# Patient Record
Sex: Female | Born: 1970 | Hispanic: Yes | Marital: Married | Smoking: Never smoker
Health system: Southern US, Community
[De-identification: ages and names within clinical notes are randomized; demographics above are authoritative.]

## PROBLEM LIST (undated history)

## (undated) DIAGNOSIS — E785 Hyperlipidemia, unspecified: Secondary | ICD-10-CM

## (undated) DIAGNOSIS — M1990 Unspecified osteoarthritis, unspecified site: Secondary | ICD-10-CM

## (undated) DIAGNOSIS — E119 Type 2 diabetes mellitus without complications: Secondary | ICD-10-CM

## (undated) DIAGNOSIS — I10 Essential (primary) hypertension: Secondary | ICD-10-CM

## (undated) HISTORY — DX: Unspecified osteoarthritis, unspecified site: M19.90

---

## 2019-06-19 ENCOUNTER — Other Ambulatory Visit: Payer: Self-pay

## 2019-06-19 ENCOUNTER — Encounter: Payer: Self-pay | Admitting: Intensive Care

## 2019-06-19 ENCOUNTER — Emergency Department
Admission: EM | Admit: 2019-06-19 | Discharge: 2019-06-19 | Disposition: A | Discharge status: Home / Self Care | Attending: Emergency Medicine | Admitting: Emergency Medicine

## 2019-06-19 ENCOUNTER — Emergency Department

## 2019-06-19 DIAGNOSIS — E119 Type 2 diabetes mellitus without complications: Secondary | ICD-10-CM | POA: Insufficient documentation

## 2019-06-19 DIAGNOSIS — R0789 Other chest pain: Secondary | ICD-10-CM | POA: Diagnosis not present

## 2019-06-19 DIAGNOSIS — R079 Chest pain, unspecified: Secondary | ICD-10-CM

## 2019-06-19 DIAGNOSIS — I10 Essential (primary) hypertension: Secondary | ICD-10-CM | POA: Diagnosis not present

## 2019-06-19 HISTORY — DX: Hyperlipidemia, unspecified: E78.5

## 2019-06-19 HISTORY — DX: Type 2 diabetes mellitus without complications: E11.9

## 2019-06-19 HISTORY — DX: Essential (primary) hypertension: I10

## 2019-06-19 LAB — CBC
HCT: 40.6 % (ref 36.0–46.0)
Hemoglobin: 14.4 g/dL (ref 12.0–15.0)
MCH: 32.3 pg (ref 26.0–34.0)
MCHC: 35.5 g/dL (ref 30.0–36.0)
MCV: 91 fL (ref 80.0–100.0)
Platelets: 232 10*3/uL (ref 150–400)
RBC: 4.46 MIL/uL (ref 3.87–5.11)
RDW: 12.7 % (ref 11.5–15.5)
WBC: 8.2 10*3/uL (ref 4.0–10.5)
nRBC: 0 % (ref 0.0–0.2)

## 2019-06-19 LAB — BASIC METABOLIC PANEL
Anion gap: 10 (ref 5–15)
BUN: 12 mg/dL (ref 6–20)
CO2: 22 mmol/L (ref 22–32)
Calcium: 8.9 mg/dL (ref 8.9–10.3)
Chloride: 104 mmol/L (ref 98–111)
Creatinine, Ser: 0.6 mg/dL (ref 0.44–1.00)
GFR calc Af Amer: >60 mL/min (ref >60)
GFR calc non Af Amer: >60 mL/min (ref >60)
Glucose, Bld: 307 mg/dL — ABNORMAL HIGH (ref 70–99)
Potassium: 3.6 mmol/L (ref 3.5–5.1)
Sodium: 136 mmol/L (ref 135–145)

## 2019-06-19 LAB — TROPONIN I (HIGH SENSITIVITY)
Troponin I (High Sensitivity): <2 ng/L (ref <18)
Troponin I (High Sensitivity): <2 ng/L (ref <18)

## 2019-06-19 IMAGING — CR DG CHEST 2V
1 series · 2 of 2 positions shown · non-contrast
Comparison: None.

CLINICAL DATA: Chest pain.

EXAM:
CHEST - 2 VIEW

[Series 1: dg chest 2 view · 0.14mm/px · 2 of 2 slices shown]
[im 1/2]
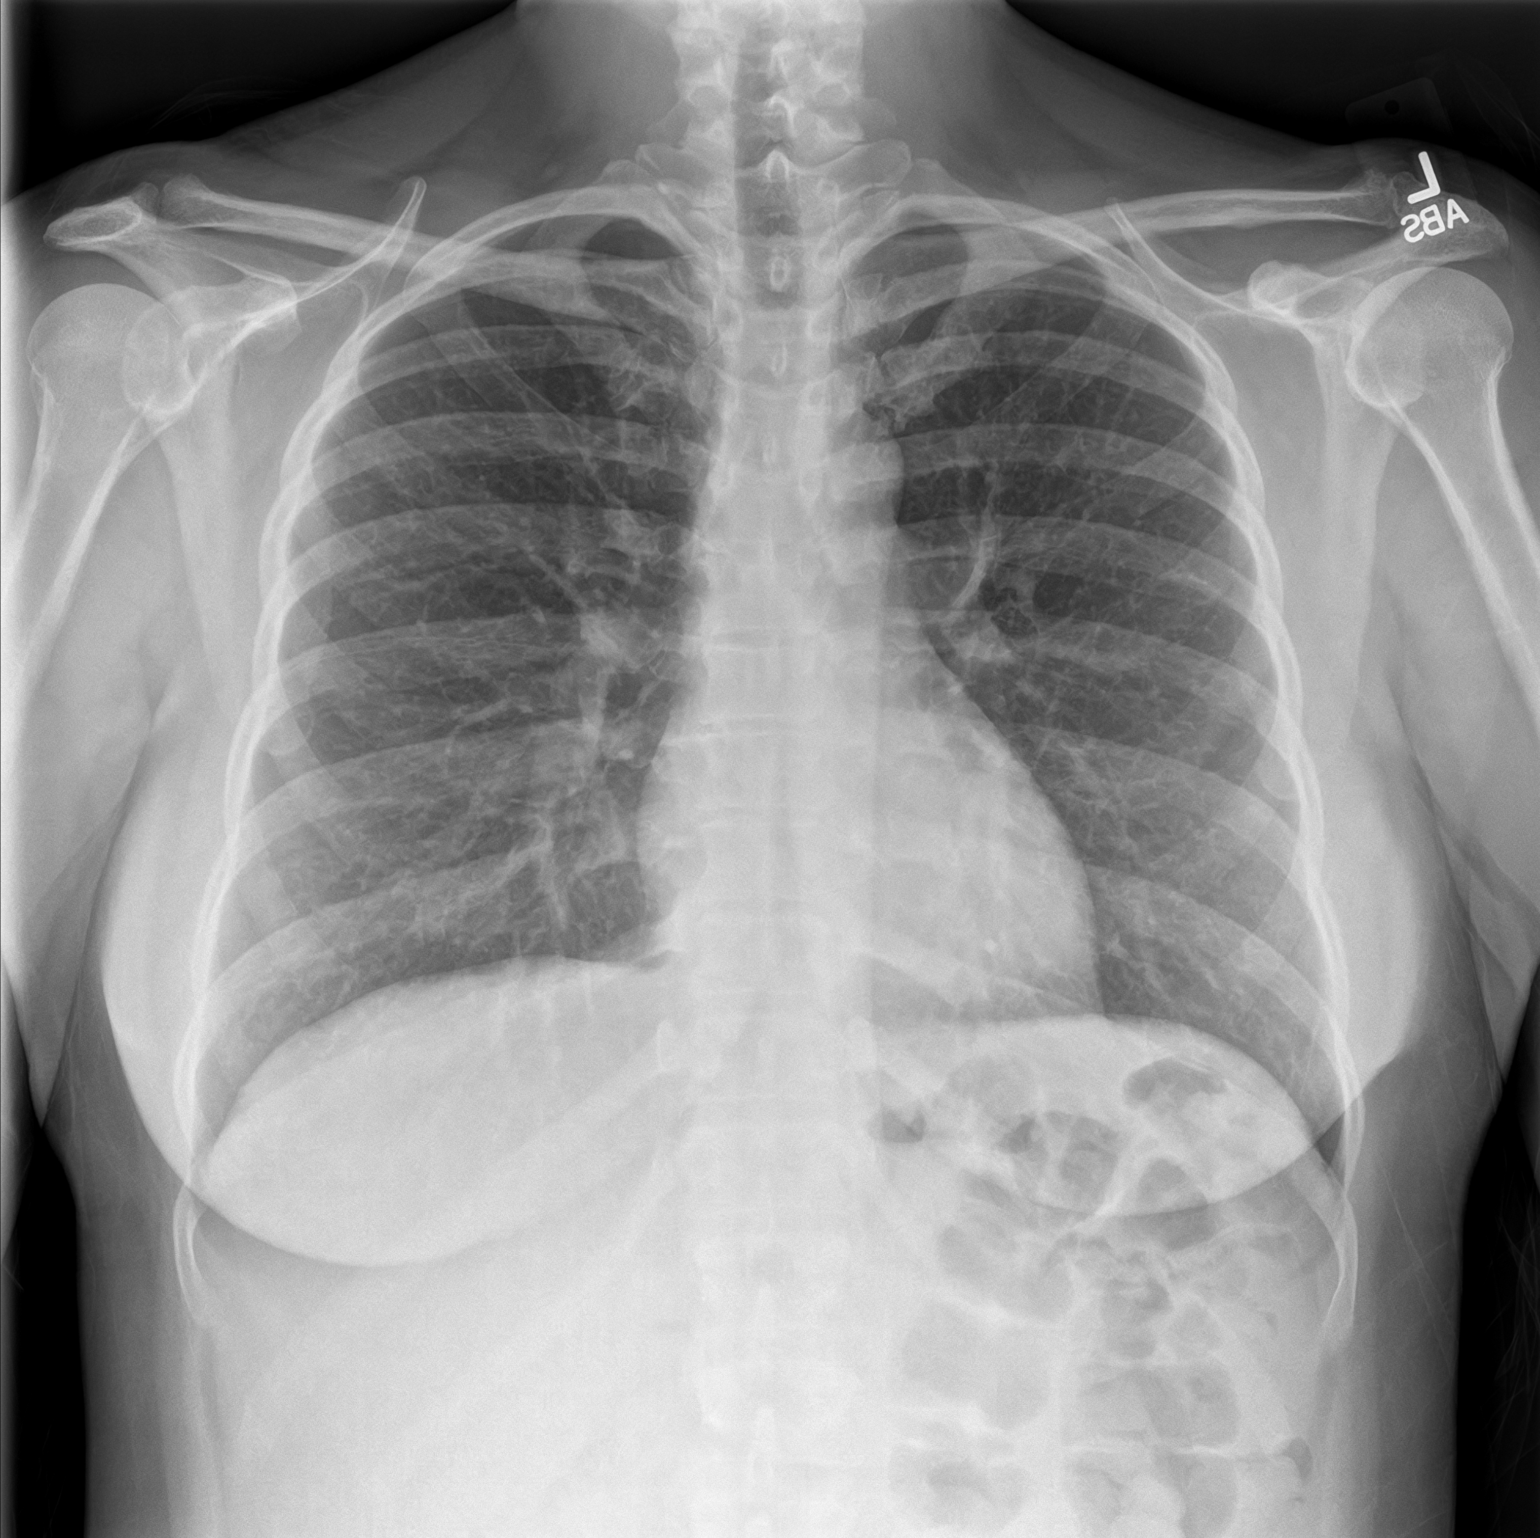
[im 2/2]
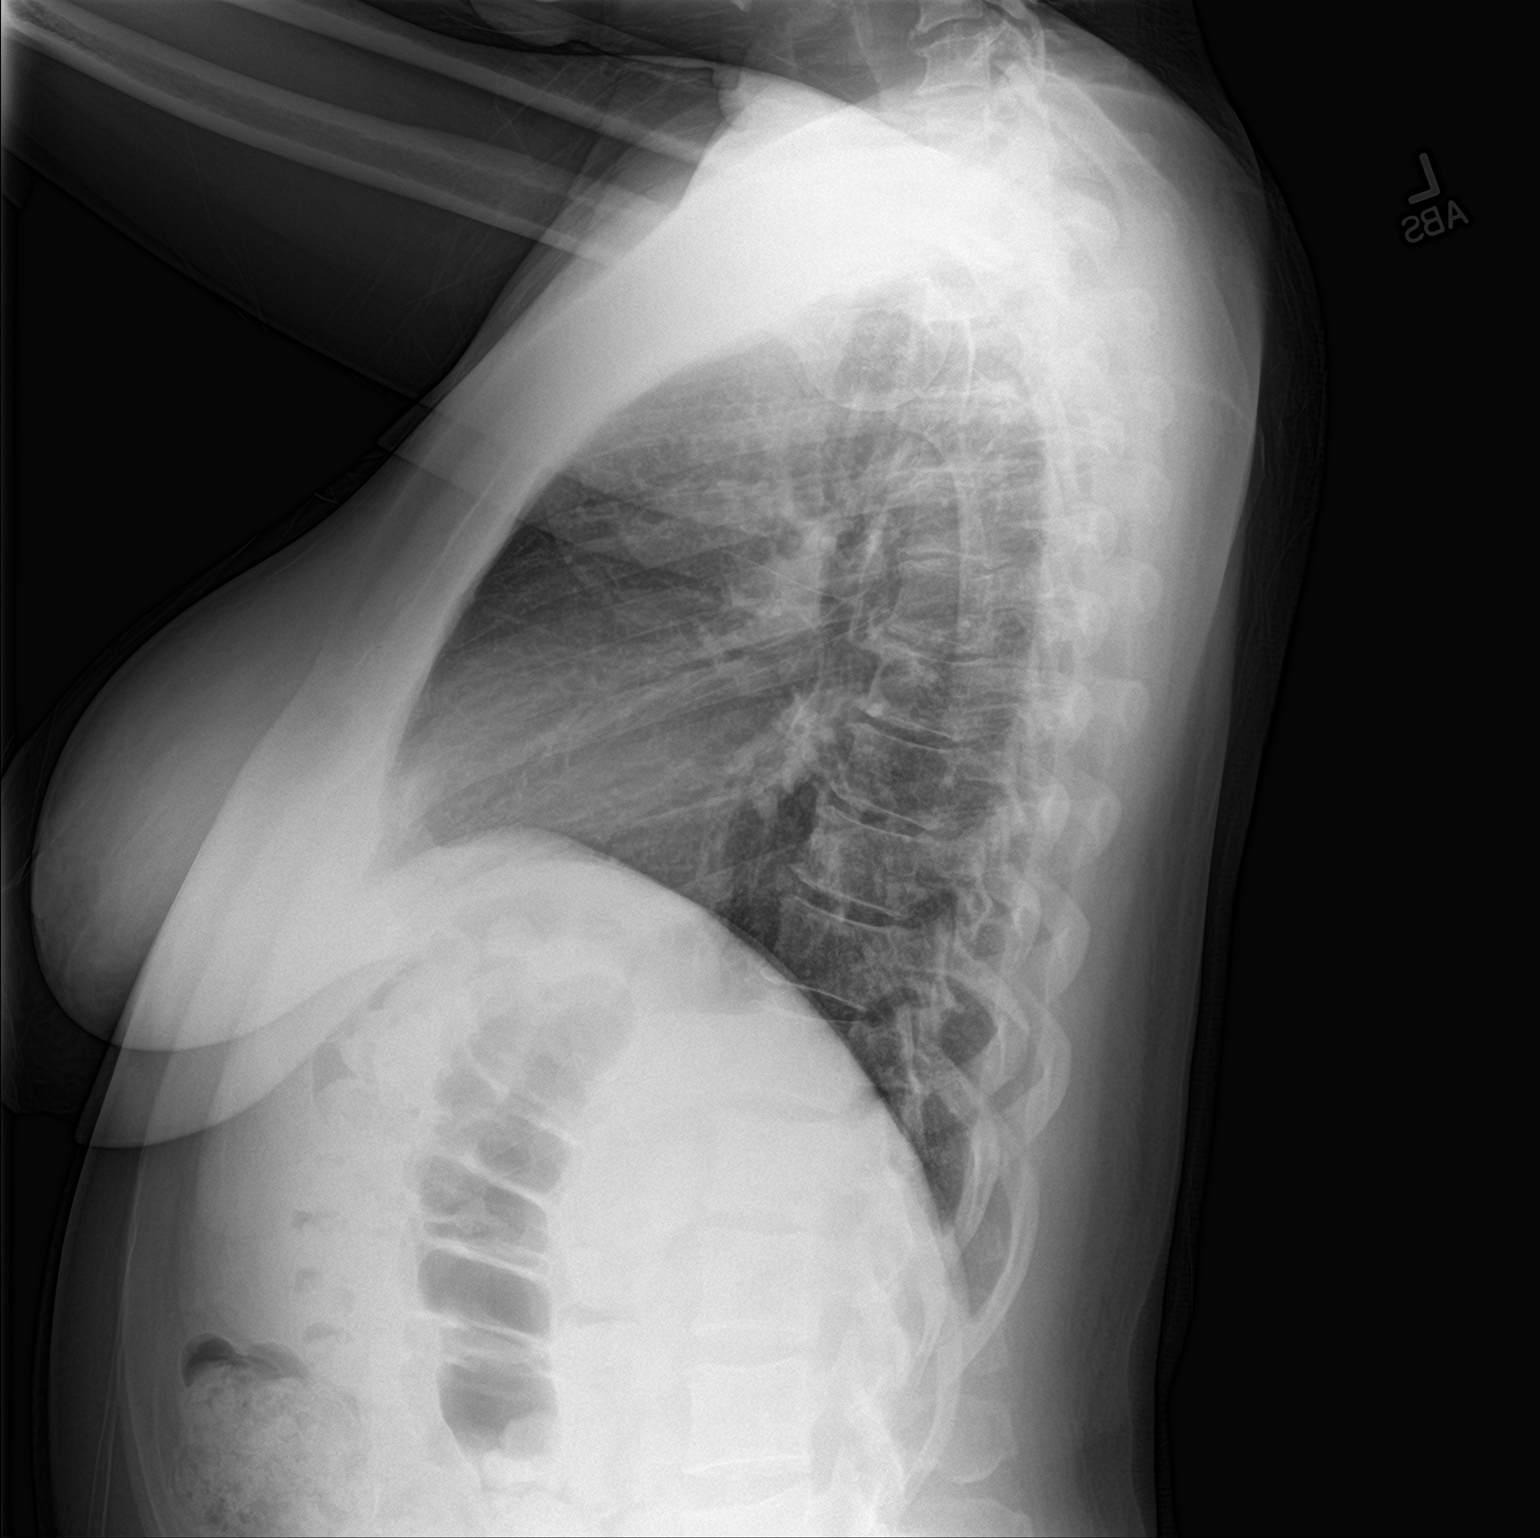

[2 of 2 positions shown; findings below may reference images not displayed]

FINDINGS: The heart size and mediastinal contours are within normal limits.
Both lungs are clear. No pneumothorax or pleural effusion is noted.
The visualized skeletal structures are unremarkable.
IMPRESSION: No active cardiopulmonary disease.

## 2019-06-19 MED ORDER — TELMISARTAN 40 MG PO TABS: 40.0000 mg | ORAL_TABLET | Freq: Every day | ORAL | 2 refills | Status: DC

## 2019-06-19 MED ORDER — METFORMIN HCL 1000 MG PO TABS: 1000.0000 mg | ORAL_TABLET | Freq: Two times a day (BID) | ORAL | 11 refills | Status: DC

## 2019-06-19 MED ORDER — METFORMIN HCL 1000 MG PO TABS: 1000.0000 mg | ORAL_TABLET | Freq: Two times a day (BID) | ORAL | 11 refills | Status: AC

## 2019-06-19 NOTE — ED Provider Notes (Signed)
[facility] Emergency Department Provider Note       Time seen: ----------------------------------------- 1:25 PM on 06/19/2019 -----------------------------------------   I have reviewed the triage vital signs and the nursing notes.  HISTORY   Chief Complaint Chest Pain   HPI Jody Gonzalez is a 48 y.o. female with a history of diabetes, hyperlipidemia, hypertension who presents to the ED for central chest pressure that radiates to the left and right side of the chest that started at 9 AM.  Pain is 7 out of 10, nothing makes it better or worse.  She is also experiencing heart palpitations.  Past Medical History:  Diagnosis Date  . Diabetes mellitus without complication ([place])   . Hyperlipidemia   . Hypertension   . Type 2 diabetes mellitus (HCC)     There are no problems to display for this patient.   History reviewed. No pertinent surgical history.  Allergies Azithromycin, Imitrex [sumatriptan], and Lisinopril  Social History Social History   Tobacco Use  . Smoking status: Never Smoker  . Smokeless tobacco: Never Used  Substance Use Topics  . Alcohol use: Yes    Comment: social  . Drug use: Never   Review of Systems Constitutional: Negative for fever. Cardiovascular: Positive for chest pain Respiratory: Negative for shortness of breath. Gastrointestinal: Negative for abdominal pain, vomiting and diarrhea. Musculoskeletal: Negative for back pain. Skin: Negative for rash. Neurological: Negative for headaches, focal weakness or numbness.  All systems negative/normal/unremarkable except as stated in the HPI  ____________________________________________   PHYSICAL EXAM:  VITAL SIGNS: ED Triage Vitals  Enc Vitals Group     BP 06/19/19 1211 134/64     Pulse Rate 06/19/19 1211 82     Resp 06/19/19 1211 16     Temp 06/19/19 1211 98.5 F (36.9 C)     Temp Source 06/19/19 1211 Oral     SpO2 06/19/19 1211 98 %     Weight 06/19/19  1205 152 lb (68.9 kg)     Height 06/19/19 1205 5\' 3"  (1.6 m)     Head Circumference --      Peak Flow --      Pain Score 06/19/19 1205 7     Pain Loc --      Pain Edu? --      Excl. in [place]? --     Constitutional: Alert and oriented. Well appearing and in no distress. Eyes: Conjunctivae are normal. Normal extraocular movements. ENT      Head: Normocephalic and atraumatic.      Nose: No congestion/rhinnorhea.      Mouth/Throat: Mucous membranes are moist.      Neck: No stridor. Cardiovascular: Normal rate, regular rhythm. No murmurs, rubs, or gallops. Respiratory: Normal respiratory effort without tachypnea nor retractions. Breath sounds are clear and equal bilaterally. No wheezes/rales/rhonchi. Gastrointestinal: Soft and nontender. Normal bowel sounds Musculoskeletal: Nontender with normal range of motion in extremities. No lower extremity tenderness nor edema. Neurologic:  Normal speech and language. No gross focal neurologic deficits are appreciated.  Skin:  Skin is warm, dry and intact. No rash noted. Psychiatric: Mood and affect are normal. Speech and behavior are normal.  ____________________________________________  EKG: Interpreted by me.  Sinus rhythm with rate of 75 bpm, normal PR interval, normal QRS, nonspecific T wave changes, normal axis  Repeat EKG interpreted by me, sinus rhythm with rate of 70 bpm, nonspecific T wave abnormalities, normal axis, normal QT ____________________________________________  ED COURSE:  As part of my medical decision  making, I reviewed the following data within the electronic MEDICAL RECORD NUMBER History obtained from family if available, nursing notes, old chart and ekg, as well as notes from prior ED visits. Patient presented for chest pressure, we will assess with labs and imaging as indicated at this time.   Procedures  Jody Gonzalez was evaluated in Emergency Department on 06/19/2019 for the symptoms described in the history of present  illness. She was evaluated in the context of the global COVID-19 pandemic, which necessitated consideration that the patient might be at risk for infection with the SARS-CoV-2 virus that causes COVID-19. Institutional protocols and algorithms that pertain to the evaluation of patients at risk for COVID-19 are in a state of rapid change based on information released by regulatory bodies including the CDC and federal and state organizations. These policies and algorithms were followed during the patient's care in the ED.  ____________________________________________   LABS (pertinent positives/negatives)  Labs Reviewed  BASIC METABOLIC PANEL - Abnormal; Notable for the following components:      Result Value   Glucose, Bld 307 (*)    All other components within normal limits  CBC  TROPONIN I (HIGH SENSITIVITY)  TROPONIN I (HIGH SENSITIVITY)    RADIOLOGY Images were viewed by me  Chest x-ray IMPRESSION: No active cardiopulmonary disease. ____________________________________________   DIFFERENTIAL DIAGNOSIS   Musculoskeletal pain, GERD, anxiety, MI, PE, pneumothorax  FINAL ASSESSMENT AND PLAN  Chest pain   Plan: The patient had presented for chest pressure. Patient's labs are unremarkable including repeat troponin. Patient's imaging did not reveal any acute process.  She will be referred to cardiology for outpatient follow-up.   Ulice Dash, MD    Note: This note was generated in part or whole with voice recognition software. Voice recognition is usually quite accurate but there are transcription errors that can and very often do occur. I apologize for any typographical errors that were not detected and corrected.     Emily Filbert, MD 06/19/19 [phone]

## 2019-06-19 NOTE — ED Notes (Signed)
EDP, Williams to bedside. 

## 2019-06-19 NOTE — ED Triage Notes (Signed)
Pt c/o central chest pressure that radiates to left and right side of chest that started at 0900 today

## 2019-07-14 ENCOUNTER — Other Ambulatory Visit: Payer: Self-pay

## 2019-07-14 ENCOUNTER — Encounter: Payer: Self-pay | Admitting: Internal Medicine

## 2019-07-14 ENCOUNTER — Ambulatory Visit (INDEPENDENT_AMBULATORY_CARE_PROVIDER_SITE_OTHER): Admitting: Internal Medicine

## 2019-07-14 VITALS — BP 122/70 | HR 84 | Ht 63.0 in | Wt 161.8 lb

## 2019-07-14 DIAGNOSIS — Z01810 Encounter for preprocedural cardiovascular examination: Secondary | ICD-10-CM

## 2019-07-14 DIAGNOSIS — R079 Chest pain, unspecified: Secondary | ICD-10-CM | POA: Diagnosis not present

## 2019-07-14 DIAGNOSIS — E785 Hyperlipidemia, unspecified: Secondary | ICD-10-CM

## 2019-07-14 DIAGNOSIS — I10 Essential (primary) hypertension: Secondary | ICD-10-CM | POA: Diagnosis not present

## 2019-07-14 MED ORDER — NITROGLYCERIN 0.4 MG SL SUBL: 0.4000 mg | SUBLINGUAL_TABLET | SUBLINGUAL | 3 refills | Status: DC | PRN

## 2019-07-14 MED ORDER — METOPROLOL TARTRATE 25 MG PO TABS: 25.0000 mg | ORAL_TABLET | Freq: Once | ORAL | 0 refills | Status: DC

## 2019-07-14 MED ORDER — ASPIRIN EC 81 MG PO TBEC: 81.0000 mg | DELAYED_RELEASE_TABLET | Freq: Every day | ORAL | 3 refills | Status: DC

## 2019-07-14 NOTE — Progress Notes (Signed)
New Outpatient Visit Date: 07/14/2019  Referring Provider: Lenise Arena, MD [facility] Emergency Department  Chief Complaint: Chest pain  HPI:  Ms. Urbani is a 49 y.o. female who is being seen today for the evaluation of chest pain at the request of Dr. Jimmye Norman. She has a history of hypertension, hyperlipidemia, type 2 diabetes mellitus, and depression.  She presented to the [facility] emergency department on 06/19/2019 complaining of chest pressure radiating along both sides of the chest accompanied by heart palpitations and ED work-up was unrevealing.  Today, Ms. Mullen reports feeling well.  However, she reports having intermittent chest pains dating back to around 2012 when she was still in the [employer].  She was admitted and underwent echocardiogram and exercise tolerance testing at that time.  No significant abnormality was observed.  She has noted intermittent chest discomfort since then.  However, last month, she noticed more chest discomfort and associated lightheadedness while working from home.  On the day of her ED visit on 12/17, she had just gotten up and walked to the kitchen when she developed central chest discomfort.  She was also lightheaded.  Her pain persisted after taking Tums and aspirin, which prompted her husband, who is an ED nurse, to bring Ms. Kumagai to the ED.  Chest pain continued through much of the ED visit, ending on its own after about 3 hours.  She had one more episode that lasted about an hour since her ED visit.  She describes the chest pain as central without radiation.  It is a hard, dull ache with a maximal intensity of 8/10.  There are no clear precipitants.  She sometimes notices associated skipped beats.  She has not had shortness of breath.  She tries to walk a mile most days, noting that this does not precipitate chest pain.  --------------------------------------------------------------------------------------------------  Cardiovascular History &  Procedures: Cardiovascular Problems:  Chest pain  Risk Factors:  Hypertension, hyperlipidemia, diabetes mellitus, and family history  Cath/PCI:  None  CV Surgery:  None  EP Procedures and Devices:  None  Non-Invasive Evaluation(s):  TTE (05/15/2011): LVEF 60% with normal wall motion and diastolic function.  Normal RV size and function.  Trace tricuspid regurgitation.  No significant abnormality.  Exercise tolerance test (2012): Normal study without ischemic changes.  Patient exercised 10:02 minutes without angina.  Recent CV Pertinent Labs: Lab Results  Component Value Date   K 3.6 06/19/2019   BUN 12 06/19/2019   CREATININE 0.60 06/19/2019    --------------------------------------------------------------------------------------------------  Past Medical History:  Diagnosis Date  . Diabetes mellitus without complication ([place])   . Hyperlipidemia   . Hypertension   . Type 2 diabetes mellitus ([place])     History reviewed. No pertinent surgical history.  Current Meds  Medication Sig  . atorvastatin (LIPITOR) 80 MG tablet Take 80 mg by mouth daily.  Marland Kitchen Conj Estrog-Medroxyprogest Ace (PREMPRO PO) Take by mouth.  . Exenatide ER (BYDUREON BCISE) 2 MG/0.85ML AUIJ Inject into the skin once a week.  . metFORMIN (GLUCOPHAGE) 1000 MG tablet Take 1 tablet (1,000 mg total) by mouth 2 (two) times daily with a meal.  . sertraline (ZOLOFT) 25 MG tablet Take 25 mg by mouth daily.  Marland Kitchen telmisartan (MICARDIS) 40 MG tablet Take 1 tablet (40 mg total) by mouth daily.    Allergies: Azithromycin, Imitrex [sumatriptan], and Lisinopril  Social History   Tobacco Use  . Smoking status: Never Smoker  . Smokeless tobacco: Never Used  Substance Use Topics  . Alcohol use:  Yes    Alcohol/week: 2.0 standard drinks    Types: 2 Cans of beer per week  . Drug use: Never    Family History  Problem Relation Age of Onset  . Diabetes Mother   . Heart attack Mother 71  . Hyperlipidemia  Father   . Hypertension Father   . Heart attack Sister 70    Review of Systems: A 12-system review of systems was performed and was negative except as noted in the HPI.  --------------------------------------------------------------------------------------------------  Physical Exam: BP 122/70 (BP Location: Left Arm, Patient Position: Sitting, Cuff Size: Normal)   Pulse 84   Ht 5\' 3"  (1.6 m)   Wt 161 lb 12 oz (73.4 kg)   SpO2 98%   BMI 28.65 kg/m   General: NAD. HEENT: No conjunctival pallor or scleral icterus. Facemask in place. Neck: Supple without lymphadenopathy, thyromegaly, JVD, or HJR. No carotid bruit. Lungs: Normal work of breathing. Clear to auscultation bilaterally without wheezes or crackles. Heart: Regular rate and rhythm without murmurs, rubs, or gallops. Non-displaced PMI. Abd: Bowel sounds present. Soft, NT/ND without hepatosplenomegaly Ext: No lower extremity edema. Radial, PT, and DP pulses are 2+ bilaterally Skin: Warm and dry without rash. Neuro: CNIII-XII intact. Strength and fine-touch sensation intact in upper and lower extremities bilaterally. Psych: Normal mood and affect.  EKG: Normal sinus rhythm without significant abnormality.  Lab Results  Component Value Date   WBC 8.2 06/19/2019   HGB 14.4 06/19/2019   HCT 40.6 06/19/2019   MCV 91.0 06/19/2019   PLT 232 06/19/2019    Lab Results  Component Value Date   NA 136 06/19/2019   K 3.6 06/19/2019   CL 104 06/19/2019   CO2 22 06/19/2019   BUN 12 06/19/2019   CREATININE 0.60 06/19/2019   GLUCOSE 307 (H) 06/19/2019    No results found for: CHOL, HDL, LDLCALC, LDLDIRECT, TRIG, CHOLHDL   --------------------------------------------------------------------------------------------------  ASSESSMENT AND PLAN: Chest pain: Ms. Cercone reports intermittent chest pain for at least 8 years, though more pronounced last month leading to ED visit.  The pain is not exertional but has typical features  including substernal chest pressure.  Ms. Askin also has multiple cardiac risk factors including hypertension, hyperlipidemia, type 2 diabetes mellitus, and family history.  Prior work-up in 2012 was unrevealing.  I have recommended that we perform a cardiac CTA.  I will have Ms. Sarracino begin aspirin 81 mg daily.  I have also provided her with a prescription for sublingual nitroglycerin to be used as needed for chest pain.  If cardiac CTA does not reveal obstructive CAD, will need to consider further work-up of paroxysmal arrhythmia.  Hypertension: Blood pressure well controlled today.  No medication changes at this time.  Hyperlipidemia: Continue high intensity statin therapy.  Follow-up: Return to clinic in 1 month.  Shellee Milo, MD 07/14/2019 12:58 PM

## 2019-07-14 NOTE — Patient Instructions (Signed)
Medication Instructions:  Your physician has recommended you make the following change in your medication:  1- START Aspirin 81 mg by mouth once a day.  2- Nitroglycerin as needed for chest pain - Dissolve 1 tablet (0.4 mg) under your tongue every 5 minutes as needed for chest pain. Do not take more than 3 doses. If chest pain does not resolve, then call 911 or go to the Emergency Room.   3- TAKE 2 hours prior to CT - Take metoprolol tartrate 25 mg  (1 tablet) by mouth once.   *If you need a refill on your cardiac medications before your next appointment, please call your pharmacy*  Lab Work: Your physician recommends that you return for lab work in 1-2 weeks once you know for sure what date your will have the Cardiac CT.  The lab work must be within 30 days. BMET Please go to the [facility]. You will check in at the front desk to the right as you walk into the atrium. Valet Parking is offered if needed.   If you have labs (blood work) drawn today and your tests are completely normal, you will receive your results only by: Marland Kitchen MyChart Message (if you have MyChart) OR . A paper copy in the mail If you have any lab test that is abnormal or we need to change your treatment, we will call you to review the results.  Testing/Procedures: Your physician has requested that you have cardiac CT. Cardiac computed tomography (CT) is a painless test that uses an x-ray machine to take clear, detailed pictures of your heart. For further information please visit [facility]. Please follow instruction sheet as given.   Your cardiac CT will be scheduled at one of the below locations:   [facility] [address] [place], [place] [address] (336) [place] [address] [place], [place] [address] [phone]  If scheduled at [facility], please arrive at the [facility] main entrance of [facility] 30-45 minutes prior to test start time. Proceed to the [facility] Radiology Department (first floor) to check-in and test prep.  If scheduled at [facility], please arrive 15 mins early for check-in and test prep.  Please follow these instructions carefully (unless otherwise directed):  On the Night Before the Test: . Be sure to Drink plenty of water. . Do not consume any caffeinated/decaffeinated beverages or chocolate 12 hours prior to your test. . Do not take any antihistamines 12 hours prior to your test.   On the Day of the Test: . Drink plenty of water. Do not drink any water within one hour of the test. . Do not eat any food 4 hours prior to the test. . You may take your regular medications prior to the test.  . Take metoprolol (Lopressor) two hours prior to test. . HOLD Furosemide/Hydrochlorothiazide morning of the test. . FEMALES- please wear underwire-free bra if available      After the Test: . Drink plenty of water. . After receiving IV contrast, you may experience a mild flushed feeling. This is normal. . On occasion, you may experience a mild rash up to 24 hours after the test. This is not dangerous. If this occurs, you can take Benadryl 25 mg and increase your fluid intake. . If you experience trouble breathing, this can be serious. If it is severe call 911 IMMEDIATELY. If it is mild, please call  our office. . If you take any of these medications: Glipizide/Metformin, Avandament, Glucavance, please do not take 48 hours after completing test unless otherwise instructed.   Once we have confirmed authorization from your insurance company, we will call you to set up a date and time for your test.   For non-scheduling related questions, please contact the cardiac imaging nurse navigator should you have any questions/concerns: Rockwell Alexandria, RN Navigator Cardiac Imaging [facility] [phone] Office      Follow-Up: At [facility], you and your health needs are our priority.  As part of our continuing mission to provide you with exceptional heart care, we have created designated Provider Care Teams.  These Care Teams include your primary Cardiologist (physician) and Advanced Practice Providers (APPs -  Physician Assistants and Nurse Practitioners) who all work together to provide you with the care you need, when you need it.  Your next appointment:   1 month(s) with Dr End or APP  The format for your next appointment:   In Person  Provider:    You may see DR Cristal Deer END or one of the following Advanced Practice Providers on your designated Care Team:    Nicolasa Ducking, NP  Eula Listen, PA-C  Marisue Ivan, PA-C   Cardiac CT Angiogram A cardiac CT angiogram is a procedure to look at the heart and the area around the heart. It may be done to help find the cause of chest pains or other symptoms of heart disease. During this procedure, a substance called contrast dye is injected into the blood vessels in the area to be checked. A large X-ray machine, called a CT scanner, then takes detailed pictures of the heart and the surrounding area. The procedure is also sometimes called a coronary CT angiogram, coronary artery scanning, or CTA. A cardiac CT angiogram allows the health care provider to see how well blood is flowing to and from the heart. The health care provider will be able to see if there are any problems, such as:  Blockage or narrowing of the coronary arteries in the heart.  Fluid around the heart.  Signs of weakness or disease in the muscles, valves, and tissues of the heart. Tell a health care provider about:  Any allergies you have. This is especially important if you have had a previous allergic reaction to contrast dye.  All medicines you are taking, including vitamins, herbs, eye drops, creams, and over-the-counter medicines.  Any blood disorders you  have.  Any surgeries you have had.  Any medical conditions you have.  Whether you are pregnant or may be pregnant.  Any anxiety disorders, chronic pain, or other conditions you have that may increase your stress or prevent you from lying still. What are the risks? Generally, this is a safe procedure. However, problems may occur, including:  Bleeding.  Infection.  Allergic reactions to medicines or dyes.  Damage to other structures or organs.  Kidney damage from the contrast dye that is used.  Increased risk of cancer from radiation exposure. This risk is low. Talk with your health care provider about: ? The risks and benefits of testing. ? How you can receive the lowest dose of radiation. What happens before the procedure?  Wear comfortable clothing and remove any jewelry, glasses, dentures, and hearing aids.  Follow instructions from your health care provider about eating and drinking. This may include: ? For 12 hours before the procedure -- avoid caffeine. This includes tea, coffee, soda,  energy drinks, and diet pills. Drink plenty of water or other fluids that do not have caffeine in them. Being well hydrated can prevent complications. ? For 4-6 hours before the procedure -- stop eating and drinking. The contrast dye can cause nausea, but this is less likely if your stomach is empty.  Ask your health care provider about changing or stopping your regular medicines. This is especially important if you are taking diabetes medicines, blood thinners, or medicines to treat problems with erections (erectile dysfunction). What happens during the procedure?   Hair on your chest may need to be removed so that small sticky patches called electrodes can be placed on your chest. These will transmit information that helps to monitor your heart during the procedure.  An IV will be inserted into one of your veins.  You might be given a medicine to control your heart rate during the  procedure. This will help to ensure that good images are obtained.  You will be asked to lie on an exam table. This table will slide in and out of the CT machine during the procedure.  Contrast dye will be injected into the IV. You might feel warm, or you may get a metallic taste in your mouth.  You will be given a medicine called nitroglycerin. This will relax or dilate the arteries in your heart.  The table that you are lying on will move into the CT machine tunnel for the scan.  The person running the machine will give you instructions while the scans are being done. You may be asked to: ? Keep your arms above your head. ? Hold your breath. ? Stay very still, even if the table is moving.  When the scanning is complete, you will be moved out of the machine.  The IV will be removed. The procedure may vary among health care providers and hospitals. What can I expect after the procedure? After your procedure, it is common to have:  A metallic taste in your mouth from the contrast dye.  A feeling of warmth.  A headache from the nitroglycerin. Follow these instructions at home:  Take over-the-counter and prescription medicines only as told by your health care provider.  If you are told, drink enough fluid to keep your urine pale yellow. This will help to flush the contrast dye out of your body.  Most people can return to their normal activities right after the procedure. Ask your health care provider what activities are safe for you.  It is up to you to get the results of your procedure. Ask your health care provider, or the department that is doing the procedure, when your results will be ready.  Keep all follow-up visits as told by your health care provider. This is important. Contact a health care provider if:  You have any symptoms of allergy to the contrast dye. These include: ? Shortness of breath. ? Rash or hives. ? A racing heartbeat. Summary  A cardiac CT angiogram  is a procedure to look at the heart and the area around the heart. It may be done to help find the cause of chest pains or other symptoms of heart disease.  During this procedure, a large X-ray machine, called a CT scanner, takes detailed pictures of the heart and the surrounding area after a contrast dye has been injected into blood vessels in the area.  Ask your health care provider about changing or stopping your regular medicines before the procedure. This is especially  important if you are taking diabetes medicines, blood thinners, or medicines to treat erectile dysfunction.  If you are told, drink enough fluid to keep your urine pale yellow. This will help to flush the contrast dye out of your body. This information is not intended to replace advice given to you by your health care provider. Make sure you discuss any questions you have with your health care provider. Document Revised: 02/12/2019 Document Reviewed: 02/12/2019 Elsevier Patient Education  [place].

## 2019-08-13 ENCOUNTER — Telehealth (HOSPITAL_COMMUNITY): Payer: Self-pay | Admitting: Emergency Medicine

## 2019-08-13 ENCOUNTER — Encounter (HOSPITAL_COMMUNITY): Payer: Self-pay

## 2019-08-13 NOTE — Telephone Encounter (Signed)
Left message on voicemail with name and callback number Ernan Runkles RN Navigator Cardiac Imaging [facility] [phone] Office [phone] Cell  

## 2019-08-14 ENCOUNTER — Encounter: Payer: Self-pay | Admitting: *Deleted

## 2019-08-14 ENCOUNTER — Ambulatory Visit: Admission: RE | Admit: 2019-08-14 | Source: Ambulatory Visit

## 2019-08-22 NOTE — Progress Notes (Deleted)
   Follow-up Outpatient Visit Date: 08/25/2019  Primary Care Provider: Jene Every, MD [address] [place] [address]  Chief Complaint: ***  HPI:  Jody Gonzalez is a 49 y.o. female with history of hypertension, hyperlipidemia, type 2 diabetes mellitus, and depression, who presents for follow-up of chest pain.  I met her last month for evaluation of chest pain and palpitations that led to ED visit in 06/2019.  She reported experiencing intermittent chest pain dating back to 2012.  Workup at that time (ETT and echo) were unremarkable.  We agreed to perform a cardiac CTA, though the patient was a no-show.  --------------------------------------------------------------------------------------------------  Cardiovascular History & Procedures: Cardiovascular Problems:  Chest pain  Risk Factors:  Hypertension, hyperlipidemia, diabetes mellitus, and family history  Cath/PCI:  None  CV Surgery:  None  EP Procedures and Devices:  None  Non-Invasive Evaluation(s):  TTE (05/15/2011): LVEF 60% with normal wall motion and diastolic function.  Normal RV size and function.  Trace tricuspid regurgitation.  No significant abnormality.  Exercise tolerance test (2012): Normal study without ischemic changes.  Patient exercised 10:02 minutes without angina.  Recent CV Pertinent Labs: Lab Results  Component Value Date   K 3.6 06/19/2019   BUN 12 06/19/2019   CREATININE 0.60 06/19/2019    Past medical and surgical history were reviewed and updated in EPIC.  No outpatient medications have been marked as taking for the 08/25/19 encounter (Appointment) with Debora Stockdale, Harrell Gave, MD.    Allergies: Azithromycin, Imitrex [sumatriptan], and Lisinopril  Social History   Tobacco Use  . Smoking status: Never Smoker  . Smokeless tobacco: Never Used  Substance Use Topics  . Alcohol use: Yes    Alcohol/week: 2.0 standard drinks    Types: 2 Cans of beer per week  . Drug use:  Never    Family History  Problem Relation Age of Onset  . Diabetes Mother   . Heart attack Mother 57  . Hyperlipidemia Father   . Hypertension Father   . Heart attack Sister 51    Review of Systems: A 12-system review of systems was performed and was negative except as noted in the HPI.  --------------------------------------------------------------------------------------------------  Physical Exam: There were no vitals taken for this visit.  General:  *** HEENT: No conjunctival pallor or scleral icterus. Facemask in place. Neck: Supple without lymphadenopathy, thyromegaly, JVD, or HJR. Lungs: Normal work of breathing. Clear to auscultation bilaterally without wheezes or crackles. Heart: Regular rate and rhythm without murmurs, rubs, or gallops. Non-displaced PMI. Abd: Bowel sounds present. Soft, NT/ND without hepatosplenomegaly Ext: No lower extremity edema. Radial, PT, and DP pulses are 2+ bilaterally. Skin: Warm and dry without rash.  EKG:  ***  Lab Results  Component Value Date   WBC 8.2 06/19/2019   HGB 14.4 06/19/2019   HCT 40.6 06/19/2019   MCV 91.0 06/19/2019   PLT 232 06/19/2019    Lab Results  Component Value Date   NA 136 06/19/2019   K 3.6 06/19/2019   CL 104 06/19/2019   CO2 22 06/19/2019   BUN 12 06/19/2019   CREATININE 0.60 06/19/2019   GLUCOSE 307 (H) 06/19/2019    No results found for: CHOL, HDL, LDLCALC, LDLDIRECT, TRIG, CHOLHDL  --------------------------------------------------------------------------------------------------  ASSESSMENT AND PLAN: ***  Nelva Bush, MD 08/22/2019 10:01 AM

## 2019-08-25 ENCOUNTER — Observation Stay
Admission: EM | Admit: 2019-08-25 | Discharge: 2019-08-26 | Disposition: A | Discharge status: Home / Self Care | Attending: Internal Medicine | Admitting: Internal Medicine

## 2019-08-25 ENCOUNTER — Other Ambulatory Visit: Payer: Self-pay

## 2019-08-25 ENCOUNTER — Ambulatory Visit: Admitting: Internal Medicine

## 2019-08-25 ENCOUNTER — Emergency Department

## 2019-08-25 DIAGNOSIS — Z888 Allergy status to other drugs, medicaments and biological substances status: Secondary | ICD-10-CM | POA: Diagnosis not present

## 2019-08-25 DIAGNOSIS — E785 Hyperlipidemia, unspecified: Secondary | ICD-10-CM | POA: Diagnosis not present

## 2019-08-25 DIAGNOSIS — Z833 Family history of diabetes mellitus: Secondary | ICD-10-CM | POA: Insufficient documentation

## 2019-08-25 DIAGNOSIS — R4701 Aphasia: Secondary | ICD-10-CM | POA: Diagnosis not present

## 2019-08-25 DIAGNOSIS — R079 Chest pain, unspecified: Secondary | ICD-10-CM | POA: Diagnosis not present

## 2019-08-25 DIAGNOSIS — E7800 Pure hypercholesterolemia, unspecified: Secondary | ICD-10-CM | POA: Insufficient documentation

## 2019-08-25 DIAGNOSIS — I10 Essential (primary) hypertension: Secondary | ICD-10-CM | POA: Diagnosis present

## 2019-08-25 DIAGNOSIS — Z881 Allergy status to other antibiotic agents status: Secondary | ICD-10-CM | POA: Insufficient documentation

## 2019-08-25 DIAGNOSIS — G43109 Migraine with aura, not intractable, without status migrainosus: Principal | ICD-10-CM | POA: Insufficient documentation

## 2019-08-25 DIAGNOSIS — Z8349 Family history of other endocrine, nutritional and metabolic diseases: Secondary | ICD-10-CM | POA: Diagnosis not present

## 2019-08-25 DIAGNOSIS — Z8249 Family history of ischemic heart disease and other diseases of the circulatory system: Secondary | ICD-10-CM | POA: Diagnosis not present

## 2019-08-25 DIAGNOSIS — G459 Transient cerebral ischemic attack, unspecified: Secondary | ICD-10-CM | POA: Diagnosis present

## 2019-08-25 DIAGNOSIS — Z7982 Long term (current) use of aspirin: Secondary | ICD-10-CM | POA: Diagnosis not present

## 2019-08-25 DIAGNOSIS — Z794 Long term (current) use of insulin: Secondary | ICD-10-CM | POA: Insufficient documentation

## 2019-08-25 DIAGNOSIS — G43909 Migraine, unspecified, not intractable, without status migrainosus: Secondary | ICD-10-CM | POA: Diagnosis present

## 2019-08-25 DIAGNOSIS — Z79899 Other long term (current) drug therapy: Secondary | ICD-10-CM | POA: Insufficient documentation

## 2019-08-25 DIAGNOSIS — E119 Type 2 diabetes mellitus without complications: Secondary | ICD-10-CM | POA: Diagnosis not present

## 2019-08-25 DIAGNOSIS — Z20822 Contact with and (suspected) exposure to covid-19: Secondary | ICD-10-CM | POA: Insufficient documentation

## 2019-08-25 DIAGNOSIS — R4789 Other speech disturbances: Secondary | ICD-10-CM | POA: Diagnosis present

## 2019-08-25 DIAGNOSIS — R519 Headache, unspecified: Secondary | ICD-10-CM

## 2019-08-25 LAB — COMPREHENSIVE METABOLIC PANEL
ALT: 42 U/L (ref 0–44)
AST: 25 U/L (ref 15–41)
Albumin: 4.4 g/dL (ref 3.5–5.0)
Alkaline Phosphatase: 103 U/L (ref 38–126)
Anion gap: 9 (ref 5–15)
BUN: 7 mg/dL (ref 6–20)
CO2: 28 mmol/L (ref 22–32)
Calcium: 9.2 mg/dL (ref 8.9–10.3)
Chloride: 100 mmol/L (ref 98–111)
Creatinine, Ser: 0.61 mg/dL (ref 0.44–1.00)
GFR calc Af Amer: >60 mL/min (ref >60)
GFR calc non Af Amer: >60 mL/min (ref >60)
Glucose, Bld: 293 mg/dL — ABNORMAL HIGH (ref 70–99)
Potassium: 3.8 mmol/L (ref 3.5–5.1)
Sodium: 137 mmol/L (ref 135–145)
Total Bilirubin: 0.8 mg/dL (ref 0.3–1.2)
Total Protein: 6.8 g/dL (ref 6.5–8.1)

## 2019-08-25 LAB — DIFFERENTIAL
Abs Immature Granulocytes: 0.03 10*3/uL (ref 0.00–0.07)
Basophils Absolute: 0 10*3/uL (ref 0.0–0.1)
Basophils Relative: 1 %
Eosinophils Absolute: 0.2 10*3/uL (ref 0.0–0.5)
Eosinophils Relative: 2 %
Immature Granulocytes: 0 %
Lymphocytes Relative: 33 %
Lymphs Abs: 2.4 10*3/uL (ref 0.7–4.0)
Monocytes Absolute: 0.6 10*3/uL (ref 0.1–1.0)
Monocytes Relative: 8 %
Neutro Abs: 4 10*3/uL (ref 1.7–7.7)
Neutrophils Relative %: 56 %

## 2019-08-25 LAB — CBC
HCT: 43 % (ref 36.0–46.0)
Hemoglobin: 15.1 g/dL — ABNORMAL HIGH (ref 12.0–15.0)
MCH: 31.8 pg (ref 26.0–34.0)
MCHC: 35.1 g/dL (ref 30.0–36.0)
MCV: 90.5 fL (ref 80.0–100.0)
Platelets: 249 10*3/uL (ref 150–400)
RBC: 4.75 MIL/uL (ref 3.87–5.11)
RDW: 11.9 % (ref 11.5–15.5)
WBC: 7.2 10*3/uL (ref 4.0–10.5)
nRBC: 0 % (ref 0.0–0.2)

## 2019-08-25 LAB — PROTIME-INR
INR: 0.8 (ref 0.8–1.2)
Prothrombin Time: 11.3 seconds — ABNORMAL LOW (ref 11.4–15.2)

## 2019-08-25 LAB — TROPONIN I (HIGH SENSITIVITY)
Troponin I (High Sensitivity): 3 ng/L (ref <18)
Troponin I (High Sensitivity): <2 ng/L (ref <18)

## 2019-08-25 LAB — APTT: aPTT: 24 seconds (ref 24–36)

## 2019-08-25 LAB — GLUCOSE, CAPILLARY: Glucose-Capillary: 294 mg/dL — ABNORMAL HIGH (ref 70–99)

## 2019-08-25 IMAGING — CT CT HEAD CODE STROKE
3 series · 16 of 45 positions shown, 19 images · non-contrast
Comparison: None.

CLINICAL DATA: Code stroke.  TIA.  Headache and chest pain

EXAM:
CT HEAD WITHOUT CONTRAST
TECHNIQUE: Contiguous axial images were obtained from the base of the skull
through the vertex without intravenous contrast.

[Series 2: head wo · axial · 0.47mm/px · z∈[-109,+6]mm · 10 of 28 slices shown, 13 images]
[im 3/28  brain]
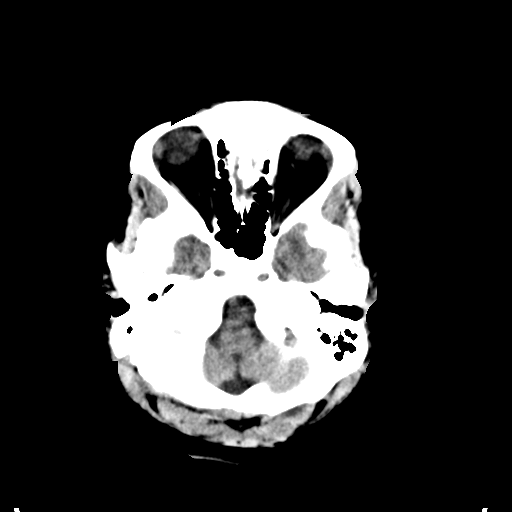
[im 3/28  bone]
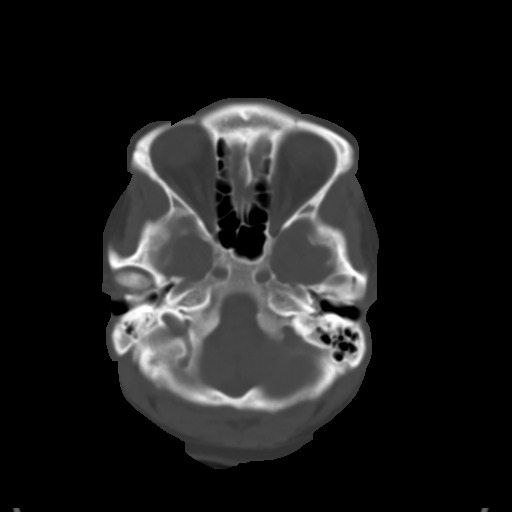
[im 5/28  brain]
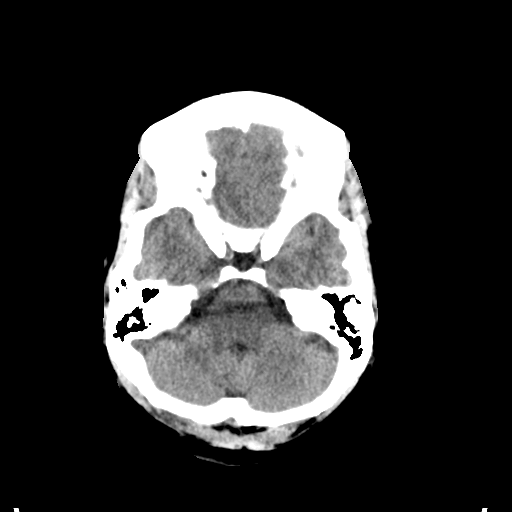
[im 8/28  brain]
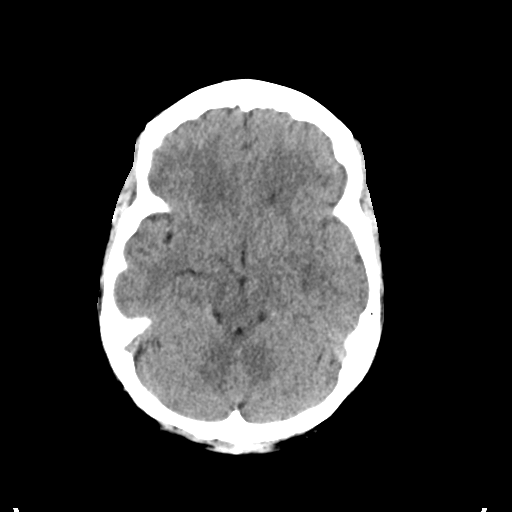
[im 11/28  brain]
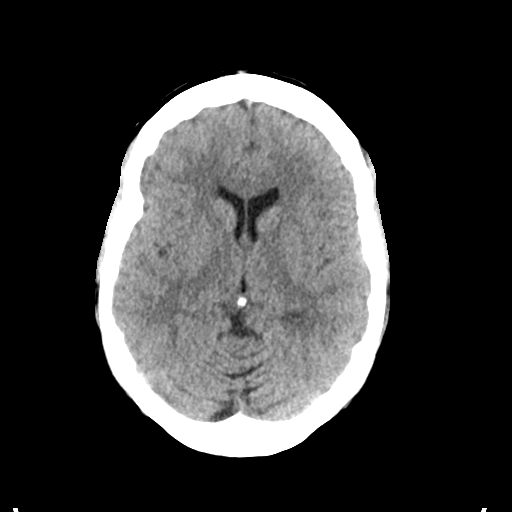
[im 13/28  brain]
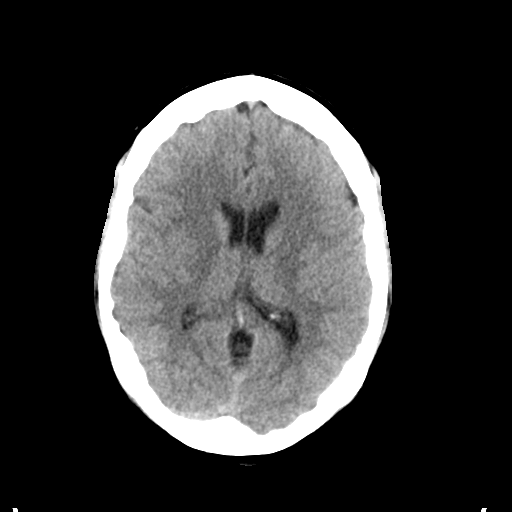
[im 13/28  bone]
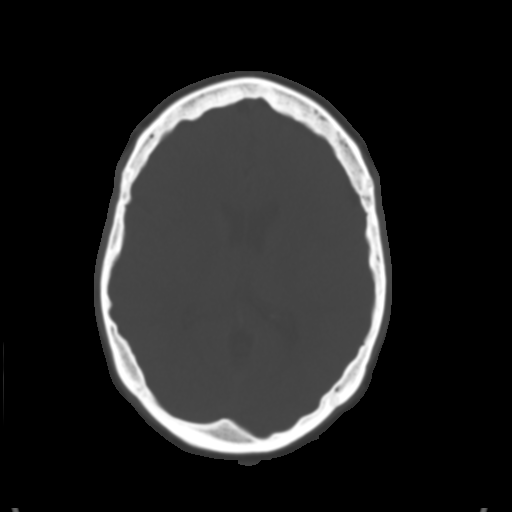
[im 16/28  brain]
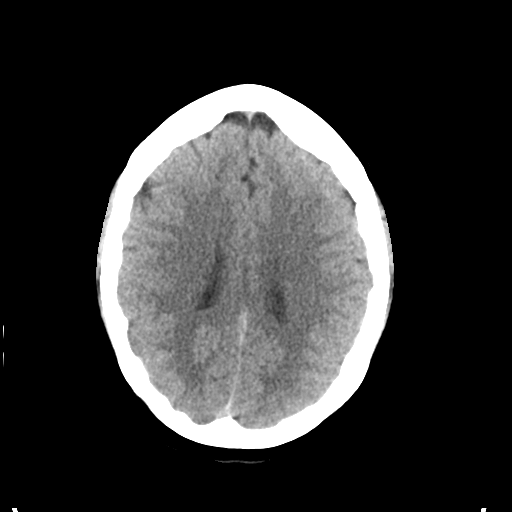
[im 18/28  brain]
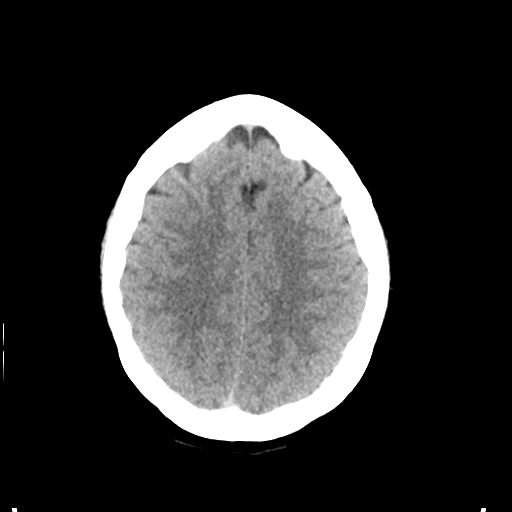
[im 21/28  brain]
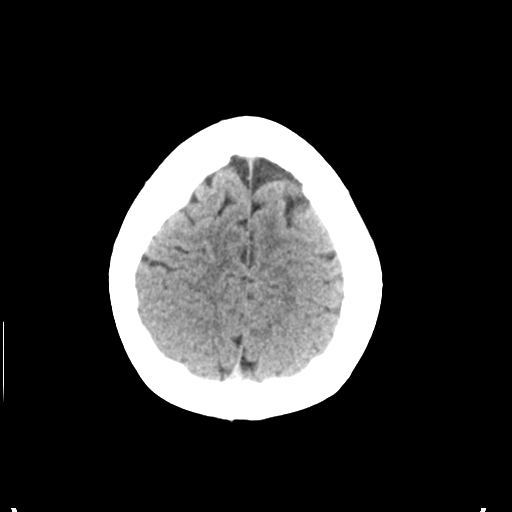
[im 24/28  brain]
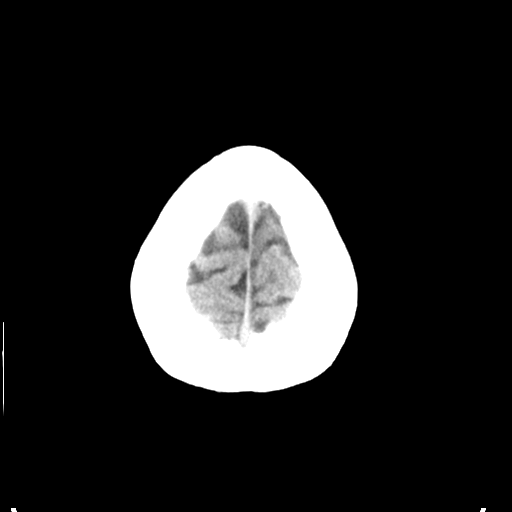
[im 24/28  bone]
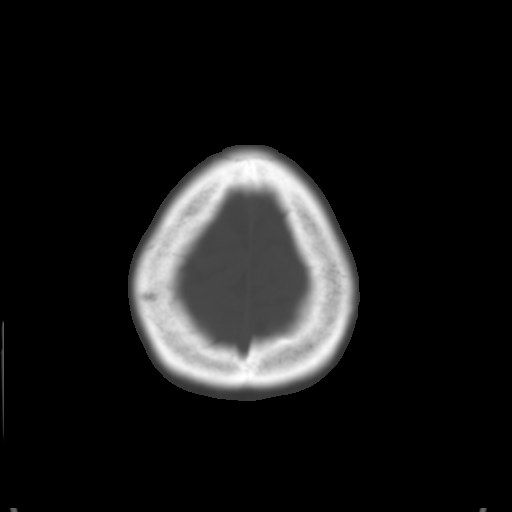
[im 26/28  brain]
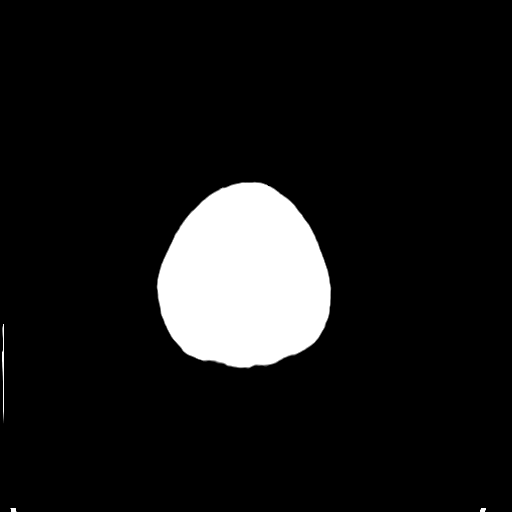

[Series 4: coronal soft tissue · coronal · 0.31mm/px · 3 of 63 slices shown]
[im 21/63  brain]
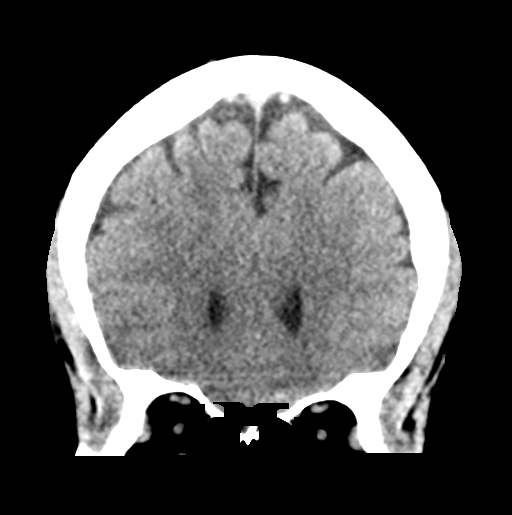
[im 28/63  brain]
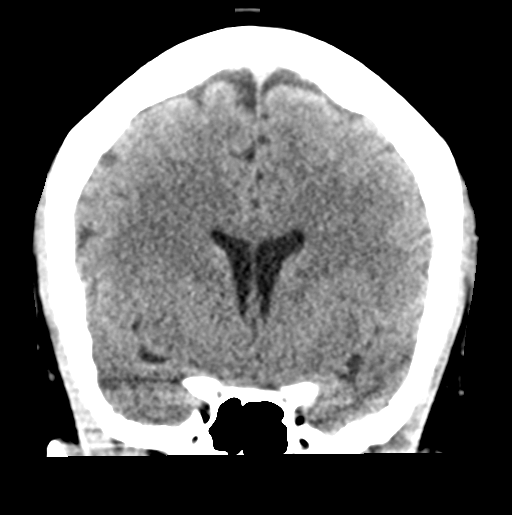
[im 35/63  brain]
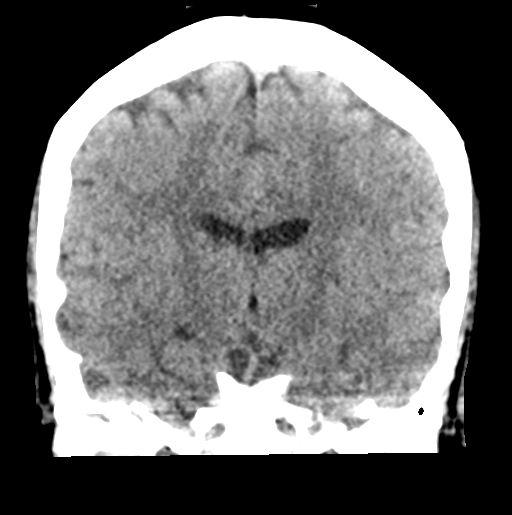

[Series 5: sagittal soft tissue · sagittal · 0.31mm/px · 3 of 54 slices shown]
[im 18/54  brain]
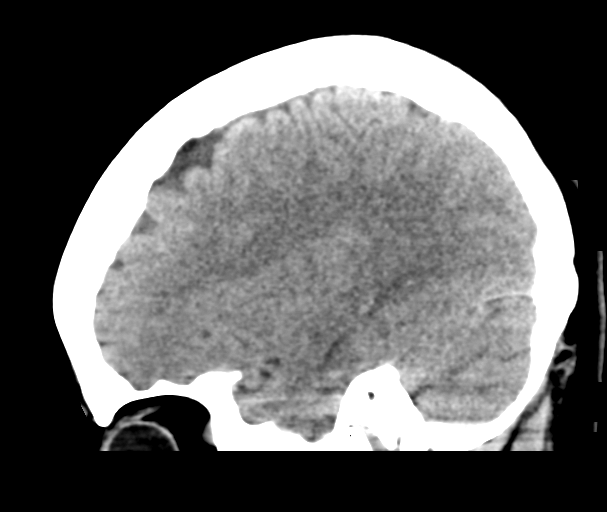
[im 27/54  brain]
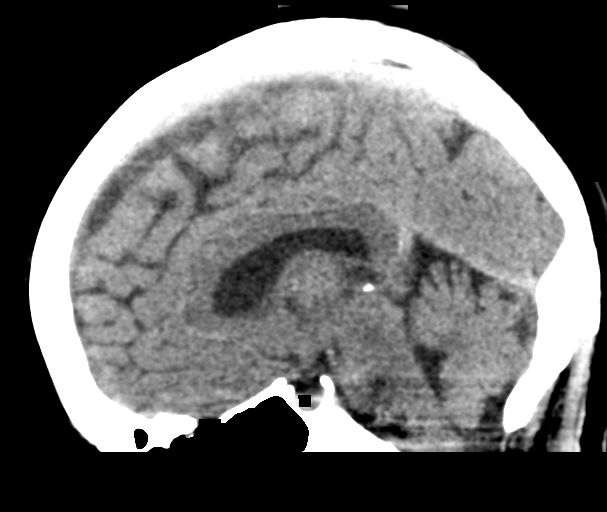
[im 36/54  brain]
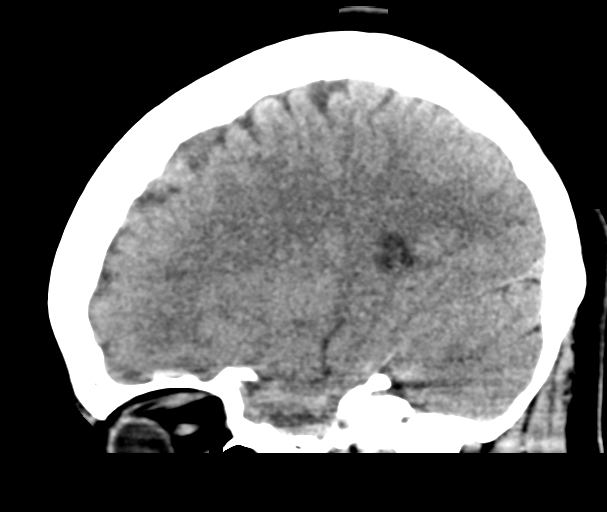

[16 of 45 positions shown; findings below may reference images not displayed]

FINDINGS: Brain: No evidence of acute infarction, hemorrhage, hydrocephalus,
extra-axial collection or mass lesion/mass effect.

Vascular: Negative for hyperdense vessel

Skull: Negative

Sinuses/Orbits: Negative

Other: None

ASPECTS (Alberta Stroke Program Early CT Score)

- Ganglionic level infarction (caudate, lentiform nuclei, internal
capsule, insula, M1-M3 cortex): 7

- Supraganglionic infarction (M4-M6 cortex): 3

Total score (0-10 with 10 being normal): 10
IMPRESSION: 1. Negative CT head
2. ASPECTS is 10
3. These results were called by telephone at the time of
interpretation on [DATE] at [DATE] to provider HA , who
verbally acknowledged these results.

## 2019-08-25 MED ORDER — IOHEXOL 350 MG/ML SOLN
100.0000 mL | Freq: Once | INTRAVENOUS | Status: AC | PRN
  Administered 2019-08-25: 100 mL via INTRAVENOUS

## 2019-08-25 MED ORDER — DIPHENHYDRAMINE HCL 50 MG/ML IJ SOLN
12.5000 mg | Freq: Once | INTRAMUSCULAR | Status: AC
  Administered 2019-08-25: 12.5 mg via INTRAVENOUS
  Filled 2019-08-25: qty 1

## 2019-08-25 MED ORDER — SODIUM CHLORIDE 0.9 % IV BOLUS
1000.0000 mL | Freq: Once | INTRAVENOUS | Status: AC
  Administered 2019-08-25: 1000 mL via INTRAVENOUS

## 2019-08-25 MED ORDER — SODIUM CHLORIDE 0.9% FLUSH
3.0000 mL | Freq: Once | INTRAVENOUS | Status: DC
Start: 2019-08-25 — End: 2019-08-26

## 2019-08-25 MED ORDER — ASPIRIN 81 MG PO CHEW
324.0000 mg | CHEWABLE_TABLET | Freq: Once | ORAL | Status: AC
  Administered 2019-08-25: 324 mg via ORAL
  Filled 2019-08-25: qty 4

## 2019-08-25 MED ORDER — PROCHLORPERAZINE EDISYLATE 10 MG/2ML IJ SOLN
5.0000 mg | Freq: Once | INTRAMUSCULAR | Status: AC
  Administered 2019-08-25: 5 mg via INTRAVENOUS
  Filled 2019-08-25: qty 2

## 2019-08-25 NOTE — Consult Note (Addendum)
[facility] Consult Services   Date of Service:   08/25/2019 16:15:00  Impression:     .  R47.01 - Aphasia  Comments/Sign-Out: acute onset expressive aphasia - concerning for L frontotemporal stroke vs complex migraine. Outside tpa window. Recommend CTA/P head/neck to eval for LVO.  Metrics: Last Known Well: 08/25/2019 01:00:00 [facility] Notification Time: 08/25/2019 16:15:00 Arrival Time: 08/25/2019 15:53:00 Stamp Time: 08/25/2019 16:15:00 Time First Login Attempt: 08/25/2019 16:22:30 Symptoms: difficulty speaking NIHSS Start Assessment Time: 08/25/2019 16:28:05 Patient is not a candidate for Alteplase/Activase. Alteplase Medical Decision: 08/25/2019 16:27:26 Patient was not deemed candidate for Alteplase/Activase thrombolytics because of Last Well Known Above 4.5 Hours.  CT head showed no acute hemorrhage or acute core infarct. CT head was reviewed.  Lower Likelihood of Large Vessel Occlusion but Following Stat Studies are Recommended  CTA Head and Neck. CT Perfusion. CTA/P head/neck negative for LVO; no role for thrombectomy.  ED Physician notified of diagnostic impression and management plan on 08/25/2019 16:33:52  Our recommendations are outlined below.  Recommendations:     .  Activate Stroke Protocol Admission/Order Set     .  Stroke/Telemetry Floor     .  Neuro Checks     .  Bedside Swallow Eval     .  DVT Prophylaxis     .  IV Fluids, Normal Saline     .  Head of Bed 30 Degrees     .  Euglycemia and Avoid Hyperthermia (PRN Acetaminophen)     .  start ASA if CT head is neg for hemorrhage.  Routine Consultation with Inhouse Neurology for Follow up Care  Sign Out:     .  Discussed with Emergency Department Provider    ------------------------------------------------------------------------------  History of Present Illness:  Patient was brought by private transportation with symptoms of difficulty speaking  49  year old woman with acute onset word finding issues noted at approximately 1200 when speaking with her coworker. At approx 1500, it was noted to be much worse. Her husband at the bedside state he last saw her normal at approximately 0100 this am.  There is no history of recent stroke.  Past Medical History:     . Hypertension     . Diabetes Mellitus     . Hyperlipidemia     . There is NO history of Atrial Fibrillation     . There is NO history of Coronary Artery Disease     . There is NO history of Stroke  Anticoagulant use:  No  Antiplatelet use: No     Examination: BP(162/93), Pulse(85), Blood Glucose(294) 1A: Level of Consciousness - Alert; keenly responsive + 0 1B: Ask Month and Age - Both Questions Right + 0 1C: Blink Eyes & Squeeze Hands - Performs Both Tasks + 0 2: Test Horizontal Extraocular Movements - Normal + 0 3: Test Visual Fields - No Visual Loss + 0 4: Test Facial Palsy (Use Grimace if Obtunded) - Normal symmetry + 0 5A: Test Left Arm Motor Drift - No Drift for 10 Seconds + 0 5B: Test Right Arm Motor Drift - No Drift for 10 Seconds + 0 6A: Test Left Leg Motor Drift - No Drift for 5 Seconds + 0 6B: Test Right Leg Motor Drift - No Drift for 5 Seconds + 0 7: Test Limb Ataxia (FNF/Heel-Shin) - No Ataxia + 0 8: Test Sensation - Normal; No sensory loss + 0 9: Test Language/Aphasia - Mild-Moderate Aphasia: Some Obvious Changes, Without Significant Limitation +  1 10: Test Dysarthria - Normal + 0 11: Test Extinction/Inattention - No abnormality + 0  NIHSS Score: 1    Patient/Family was informed the Neurology Consult would occur via TeleHealth consult by way of interactive audio and video telecommunications and consented to receiving care in this manner.   Due to the immediate potential for life-threatening deterioration due to underlying acute neurologic illness, I spent 20 minutes providing critical care. This time includes time for face to face visit via  telemedicine, review of medical records, imaging studies and discussion of findings with providers, the patient and/or family.   Dr Hal Morales   [facility] [phone]  Case [id number]

## 2019-08-25 NOTE — ED Notes (Signed)
Pt reports seeing "flashing or sparkling" in her right peripheral vision "a couple days ago," which she reports as new. Pt reports no headache followed

## 2019-08-25 NOTE — ED Notes (Signed)
Attempted to call report to floor, unable to reach RN. Left callback number

## 2019-08-25 NOTE — ED Triage Notes (Addendum)
Reports headache that started at 10AM with mild chest pain resolved with nitro. Pt reports that at 12PM she was on phone with a friend and began having trouble finding words. Pt with continued with aphasia and headache upon presentation to ER. Pt speech is clear but pt slow to answer and appears to be searching for words. Alert and oriented X4. No extremity weakness. Able to stand and pivot from chair.

## 2019-08-25 NOTE — ED Notes (Signed)
CODE STROKE CALLED TO [phone] 

## 2019-08-25 NOTE — ED Notes (Signed)
Rainbow was sent to lab. 

## 2019-08-25 NOTE — ED Provider Notes (Signed)
[facility] Emergency Department Provider Note  ____________________________________________   First MD Initiated Contact with Patient 08/25/19 1637     (approximate)  I have reviewed the triage vital signs and the nursing notes.  History  Chief Complaint Headache, Chest Pain, and Aphasia    HPI Jody Gonzalez is a 49 y.o. female who presents for headache and word finding difficulty. Patient initially developed some headache and chest pain around 10 AM. Took nitro with relief of chest pain, headache ongoing. Was on the phone with her coworker at noon when coworker noticed she was having some difficulty getting words out at a normal pace. Talked to her coworker on the phone again at 3 PM who said the word finding problems were significantly more pronounced, that it was taking her a long time to get her words out.   On arrival to the ED she complains of headache. Does have hx of headaches vs migraine, but has never had associated neurological symptoms with them. She is able to get words out correctly but seems to have to focus significantly to get them out. She feels this has been getting slightly better since onset. Husband at bedside says speech is not as crisp/clear as it normally is.  No associated facial droop, extremity weakness, numbness.  She reports a frontal headache, moderate in severity. No radiation.  No alleviating or aggravating components.  Last time she was seen definitively normal was around 1 AM by husband.    Past Medical Hx Past Medical History:  Diagnosis Date  . Diabetes mellitus without complication (HCC)   . Hyperlipidemia   . Hypertension   . Type 2 diabetes mellitus (HCC)     Problem List Patient Active Problem List   Diagnosis Date Noted  . Chest pain, moderate coronary artery risk 07/14/2019  . Essential hypertension 07/14/2019  . Hyperlipidemia LDL goal <70 07/14/2019    Past Surgical Hx History reviewed. No pertinent  surgical history.  Medications Prior to Admission medications   Medication Sig Start Date End Date Taking? Authorizing Provider  aspirin EC 81 MG tablet Take 1 tablet (81 mg total) by mouth daily. 07/14/19   End, Cristal Deer, MD  atorvastatin (LIPITOR) 80 MG tablet Take 80 mg by mouth daily.    [provider]  Conj Estrog-Medroxyprogest Ace (PREMPRO PO) Take by mouth.    [provider]  Exenatide ER (BYDUREON BCISE) 2 MG/0.85ML AUIJ Inject into the skin once a week.    [provider]  metFORMIN (GLUCOPHAGE) 1000 MG tablet Take 1 tablet (1,000 mg total) by mouth 2 (two) times daily with a meal. 06/19/19 06/18/20  Emily Filbert, MD  metoprolol tartrate (LOPRESSOR) 25 MG tablet Take 1 tablet (25 mg total) by mouth once for 1 dose. About 2 hours prior to CT. 07/14/19 07/14/19  End, Cristal Deer, MD  nitroGLYCERIN (NITROSTAT) 0.4 MG SL tablet Place 1 tablet (0.4 mg total) under the tongue every 5 (five) minutes as needed for chest pain. Maximum of 3 doses. 07/14/19 10/12/19  End, Cristal Deer, MD  sertraline (ZOLOFT) 25 MG tablet Take 25 mg by mouth daily.    [provider]  telmisartan (MICARDIS) 40 MG tablet Take 1 tablet (40 mg total) by mouth daily. 06/19/19   Emily Filbert, MD    Allergies Azithromycin, Imitrex [sumatriptan], and Lisinopril  Family Hx Family History  Problem Relation Age of Onset  . Diabetes Mother   . Heart attack Mother 22  . Hyperlipidemia Father   .  Hypertension Father   . Heart attack Sister 35    Social Hx Social History   Tobacco Use  . Smoking status: Never Smoker  . Smokeless tobacco: Never Used  Substance Use Topics  . Alcohol use: Yes    Alcohol/week: 2.0 standard drinks    Types: 2 Cans of beer per week  . Drug use: Never     Review of Systems  Constitutional: Negative for fever, chills. Eyes: Negative for visual changes. ENT: Negative for sore throat. Cardiovascular: Positive for chest  pain. Respiratory: Negative for shortness of breath. Gastrointestinal: Negative for nausea, vomiting.  Genitourinary: Negative for dysuria. Musculoskeletal: Negative for leg swelling. Skin: Negative for rash. Neurological: Positive for headaches.   Physical Exam  Vital Signs: ED Triage Vitals  Enc Vitals Group     BP 08/25/19 1559 136/87     Pulse Rate 08/25/19 1559 82     Resp 08/25/19 1559 16     Temp 08/25/19 1559 98.9 F (37.2 C)     Temp Source 08/25/19 1559 Oral     SpO2 08/25/19 1559 100 %     Weight 08/25/19 1559 160 lb (72.6 kg)     Height 08/25/19 1559 5\' 3"  (1.6 m)     Head Circumference --      Peak Flow --      Pain Score 08/25/19 1600 10     Pain Loc --      Pain Edu? --      Excl. in [place]? --     Constitutional: Alert and oriented.  Head: Normocephalic. Atraumatic. Eyes: Conjunctivae clear. Sclera anicteric. Visual fields in tact. EOMI. Nose: No congestion. No rhinorrhea. Mouth/Throat: Wearing mask.  Neck: No stridor.   Cardiovascular: Normal rate, regular rhythm. Extremities well perfused. Respiratory: Normal respiratory effort.  Lungs CTAB. Gastrointestinal: Soft. Non-tender. Non-distended.  Musculoskeletal: No lower extremity edema. No deformities. Neurologic:  Alert and oriented.  Face symmetric.  Tongue midline.  Cranial nerves II through XII intact. UE and LE strength 5/5 and symmetric. UE and LE SILT. Finger to nose and heel to shin in tact. Seems to have to focus longer than normal with naming objects, but is able to correctly identify objects. In tact repetition. No receptive aphasia. NIHSS 1.  Skin: Skin is warm, dry and intact. No rash noted. Psychiatric: Mood and affect are appropriate for situation.  EKG  Personally reviewed.   Rate: 76 Rhythm: sinus Axis: normal Intervals: WNL Q waves inferiorly No acute ischemic changes No STEMI    Radiology  CT head: IMPRESSION:  1. Negative CT head  2. ASPECTS is 10  3. These results were  called by telephone at the time of  interpretation on 08/25/2019 at 4:15 pm to provider [facility] , who  verbally acknowledged these results.   CTA head/neck and perfusion: IMPRESSION:  CT head:  1. No evidence of acute intracranial abnormality.  2. Minimal scattered ill-defined hypoattenuation within the cerebral  white matter is nonspecific, but consistent with chronic small  vessel ischemic disease.   CTA neck:  The bilateral common carotid, internal carotid and vertebral  arteries are patent within the neck without stenosis.   CTA head:  No intracranial large vessel occlusion or proximal high-grade  arterial stenosis.   CT perfusion head:  The perfusion software identifies no core infarct. The perfusion  software identifies no region of critically hypoperfused parenchyma  utilizing the region Tmax>6 seconds threshold.    Procedures  Procedure(s) performed (including critical care):  Procedures   Initial Impression / Assessment and Plan / ED Course  49 y.o. female who presents to the ED for headache, slowness in word finding.  Ddx: TIA/CVA, complex migraine, stress reaction  CODE STROKE initiated on arrival, however on more detailed history LKN was 1 AM this morning, therefore not within window for tPA. Will obtain CT angio and perfusion per Neurology. Will trial migraine cocktail. Will likely require admit for further TIA work up.   CT angio and perfusion imaging negative for large vessel occlusion, stenosis, or perfusion issues. Given her risk factors, will plan for admit for TIA work up r/o. Patient and husband agreeable.  Patient and husband do note that her speech difficulties do seem to be to be improving compared to arrival.  Reports improvement in headache as well with migraine cocktail.  Final Clinical Impression(s) / ED Diagnosis  Final diagnoses:  Complaint of headache  Word finding difficulty       Note:  This document was prepared using Dragon  voice recognition software and may include unintentional dictation errors.   Miguel Aschoff., MD 08/26/19 [phone]

## 2019-08-25 NOTE — ED Notes (Signed)
Pt reports she cancelled her doctors appointment this AM due to feeling "off." Pt reports headache began at 1000. Pt friend reports pt started to seem slightly different at 1200. Pt friend reports changes greatly noticeable by 1500. Pt has some trouble finding words and reports headache.

## 2019-08-26 ENCOUNTER — Observation Stay
Admit: 2019-08-26 | Discharge: 2019-08-26 | Disposition: A | Discharge status: Home / Self Care | Attending: Emergency Medicine | Admitting: Emergency Medicine

## 2019-08-26 ENCOUNTER — Observation Stay

## 2019-08-26 DIAGNOSIS — E785 Hyperlipidemia, unspecified: Secondary | ICD-10-CM | POA: Diagnosis not present

## 2019-08-26 DIAGNOSIS — R519 Headache, unspecified: Secondary | ICD-10-CM | POA: Diagnosis not present

## 2019-08-26 DIAGNOSIS — R4789 Other speech disturbances: Secondary | ICD-10-CM | POA: Diagnosis not present

## 2019-08-26 DIAGNOSIS — G43909 Migraine, unspecified, not intractable, without status migrainosus: Secondary | ICD-10-CM

## 2019-08-26 DIAGNOSIS — I10 Essential (primary) hypertension: Secondary | ICD-10-CM

## 2019-08-26 DIAGNOSIS — Z794 Long term (current) use of insulin: Secondary | ICD-10-CM

## 2019-08-26 DIAGNOSIS — G459 Transient cerebral ischemic attack, unspecified: Secondary | ICD-10-CM

## 2019-08-26 DIAGNOSIS — E119 Type 2 diabetes mellitus without complications: Secondary | ICD-10-CM

## 2019-08-26 DIAGNOSIS — G43809 Other migraine, not intractable, without status migrainosus: Secondary | ICD-10-CM

## 2019-08-26 LAB — COMPREHENSIVE METABOLIC PANEL
ALT: 35 U/L (ref 0–44)
AST: 19 U/L (ref 15–41)
Albumin: 3.7 g/dL (ref 3.5–5.0)
Alkaline Phosphatase: 72 U/L (ref 38–126)
Anion gap: 8 (ref 5–15)
BUN: 8 mg/dL (ref 6–20)
CO2: 26 mmol/L (ref 22–32)
Calcium: 8.3 mg/dL — ABNORMAL LOW (ref 8.9–10.3)
Chloride: 103 mmol/L (ref 98–111)
Creatinine, Ser: 0.58 mg/dL (ref 0.44–1.00)
GFR calc Af Amer: >60 mL/min (ref >60)
GFR calc non Af Amer: >60 mL/min (ref >60)
Glucose, Bld: 226 mg/dL — ABNORMAL HIGH (ref 70–99)
Potassium: 3.8 mmol/L (ref 3.5–5.1)
Sodium: 137 mmol/L (ref 135–145)
Total Bilirubin: 1 mg/dL (ref 0.3–1.2)
Total Protein: 5.9 g/dL — ABNORMAL LOW (ref 6.5–8.1)

## 2019-08-26 LAB — ECHOCARDIOGRAM COMPLETE
Height: 63 in
Weight: 2560 oz

## 2019-08-26 LAB — CBC
HCT: 38.8 % (ref 36.0–46.0)
Hemoglobin: 13.4 g/dL (ref 12.0–15.0)
MCH: 31.9 pg (ref 26.0–34.0)
MCHC: 34.5 g/dL (ref 30.0–36.0)
MCV: 92.4 fL (ref 80.0–100.0)
Platelets: 199 10*3/uL (ref 150–400)
RBC: 4.2 MIL/uL (ref 3.87–5.11)
RDW: 12 % (ref 11.5–15.5)
WBC: 5.9 10*3/uL (ref 4.0–10.5)
nRBC: 0 % (ref 0.0–0.2)

## 2019-08-26 LAB — HEMOGLOBIN A1C
Hgb A1c MFr Bld: 9.2 % — ABNORMAL HIGH (ref 4.8–5.6)
Mean Plasma Glucose: 217.34 mg/dL

## 2019-08-26 LAB — GLUCOSE, CAPILLARY
Glucose-Capillary: 203 mg/dL — ABNORMAL HIGH (ref 70–99)
Glucose-Capillary: 150 mg/dL — ABNORMAL HIGH (ref 70–99)

## 2019-08-26 LAB — LIPID PANEL
Cholesterol: 141 mg/dL (ref 0–200)
HDL: 29 mg/dL — ABNORMAL LOW (ref >40)
LDL Cholesterol: 83 mg/dL (ref 0–99)
Total CHOL/HDL Ratio: 4.9 RATIO
Triglycerides: 145 mg/dL (ref <150)
VLDL: 29 mg/dL (ref 0–40)

## 2019-08-26 LAB — HIV ANTIBODY (ROUTINE TESTING W REFLEX): HIV Screen 4th Generation wRfx: NONREACTIVE

## 2019-08-26 LAB — SARS CORONAVIRUS 2 (TAT 6-24 HRS): SARS Coronavirus 2: NEGATIVE

## 2019-08-26 IMAGING — MR MR HEAD W/O CM
12 series · 46 of 48 positions shown · non-contrast
Comparison: None.

CLINICAL DATA: Transient ischemic attack.  Migraine.

EXAM:
MRI HEAD WITHOUT CONTRAST
TECHNIQUE: Multiplanar, multiecho pulse sequences of the brain and surrounding
structures were obtained without intravenous contrast.

[Series 5: ax dwi_tracew · axial · 3.0mm · 0.60mm/px · z∈[-114,+39]mm · 4 of 48 slices shown]
[im 1/48]
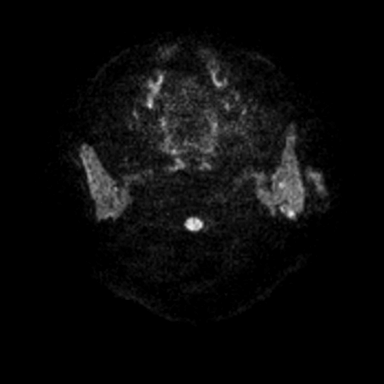
[im 16/48]
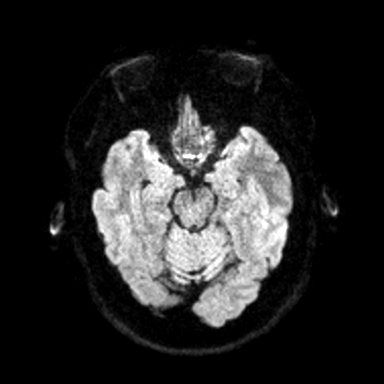
[im 32/48]
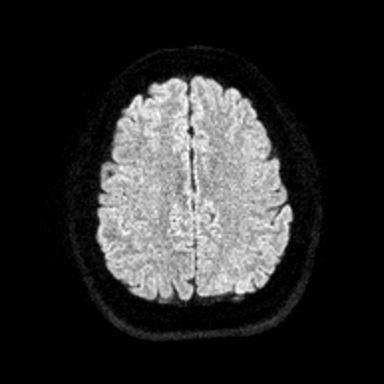
[im 48/48]
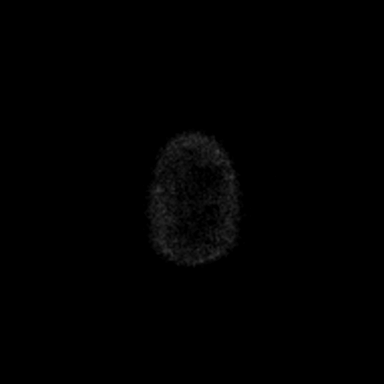

[Series 6: ax dwi_adc · axial · 3.0mm · 0.60mm/px · z∈[-114,+39]mm · 3 of 48 slices shown]
[im 1/48]
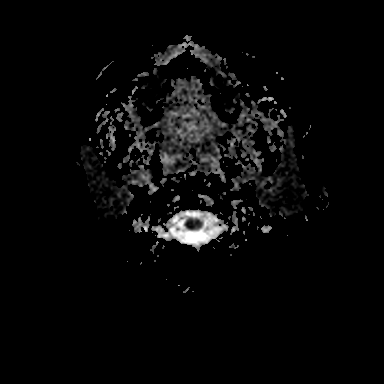
[im 24/48]
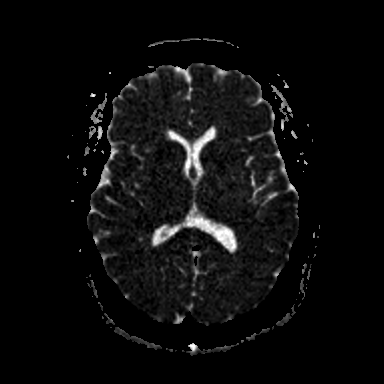
[im 48/48]
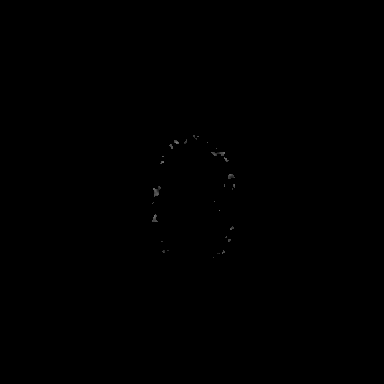

[Series 7: cor dwi_tracew · coronal · 5.0mm · 0.60mm/px · 3 of 40 slices shown]
[im 1/40]
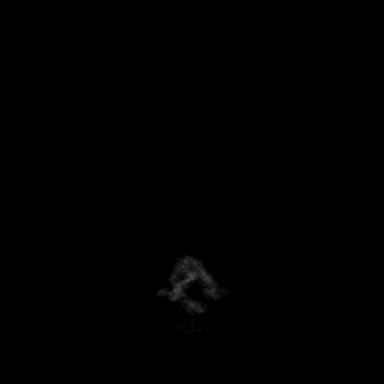
[im 20/40]
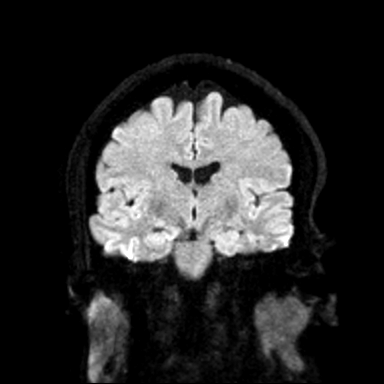
[im 40/40]
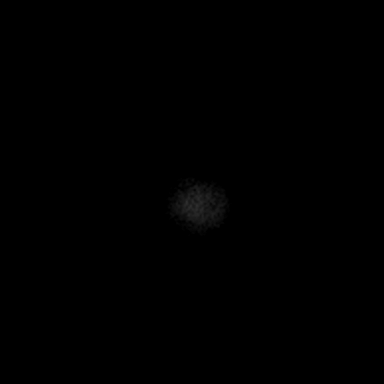

[Series 8: cor dwi_adc · coronal · 5.0mm · 0.60mm/px · 3 of 39 slices shown]
[im 1/39]
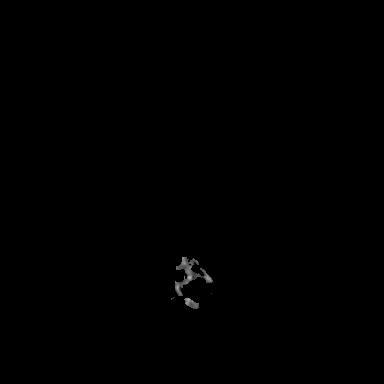
[im 20/39]
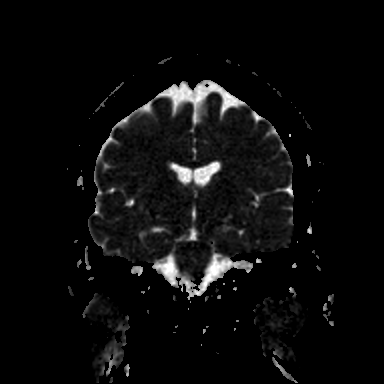
[im 39/39]
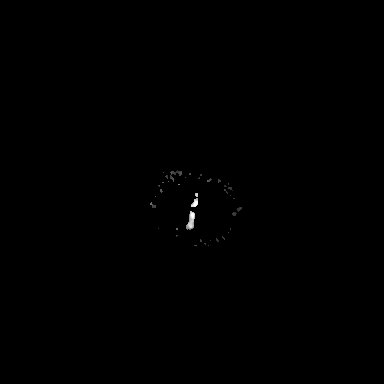

[Series 9: T1 · sagittal · 5.0mm · 0.62mm/px · 2 of 24 slices shown (1 of 2)]
[im 1/24]
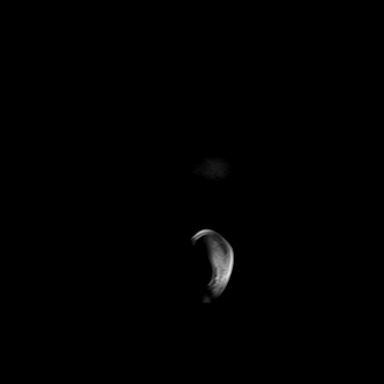
[im 24/24]
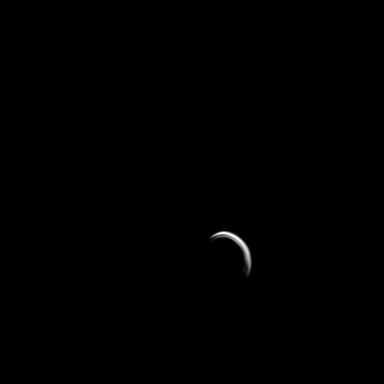

[Series 10: T2 · axial · 5.0mm · 0.53mm/px · z∈[-112,+30]mm · 2 of 25 slices shown (1 of 2)]
[im 1/25]
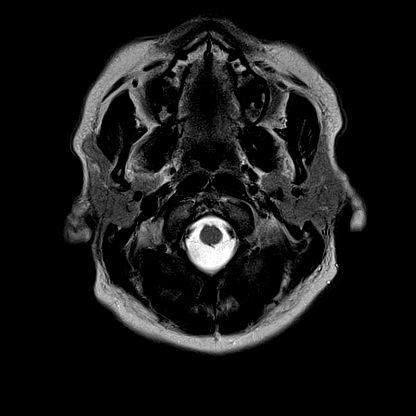
[im 25/25]
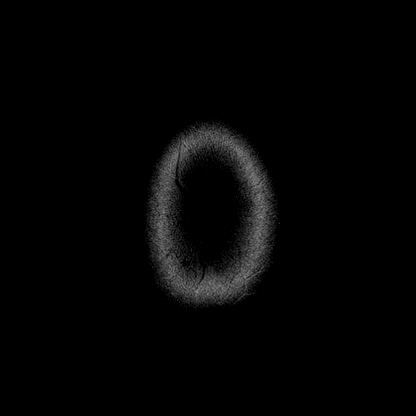

[Series 11: mag_images · axial · 3.0mm · 0.90mm/px · z∈[-130,+45]mm · 4 of 60 slices shown]
[im 1/60]
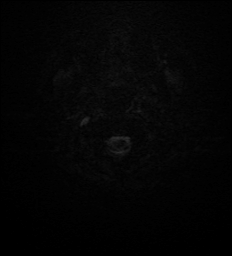
[im 20/60]
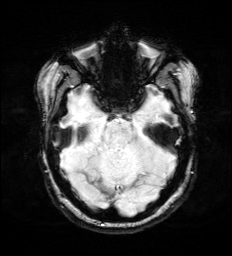
[im 40/60]
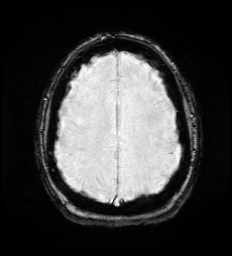
[im 60/60]
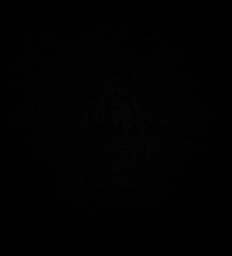

[Series 12: pha_images · axial · 3.0mm · 0.90mm/px · z∈[-130,+39]mm · 4 of 58 slices shown]
[im 1/58]
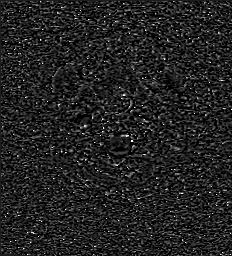
[im 20/58]
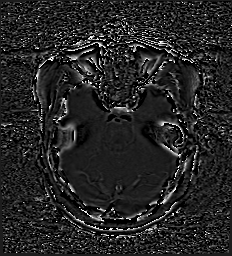
[im 39/58]
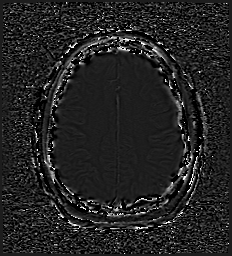
[im 58/58]
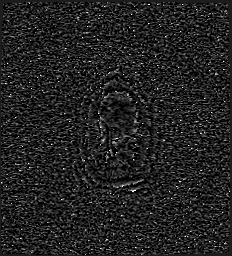

[Series 13: swi_images · axial · 3.0mm · 0.90mm/px · z∈[-130,+45]mm · 4 of 60 slices shown]
[im 1/60]
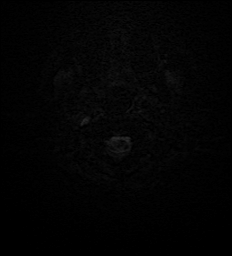
[im 20/60]
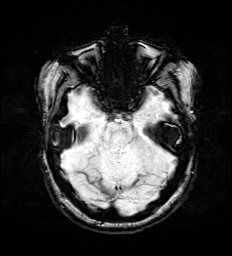
[im 40/60]
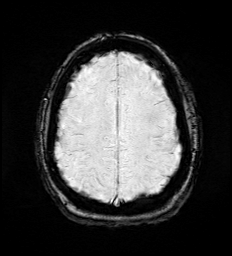
[im 60/60]
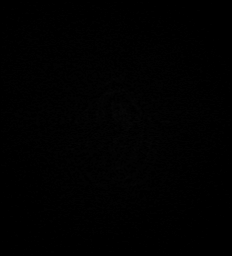

[Series 15: FLAIR · axial · 3.0mm · 0.53mm/px · z∈[-121,+38]mm · 4 of 55 slices shown]
[im 1/55]
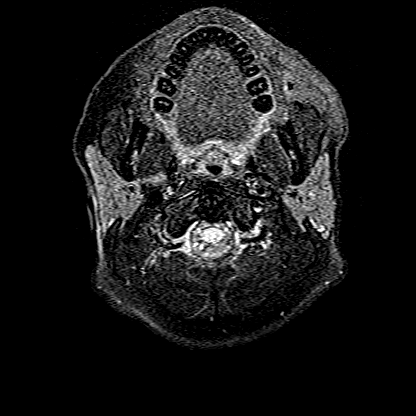
[im 19/55]
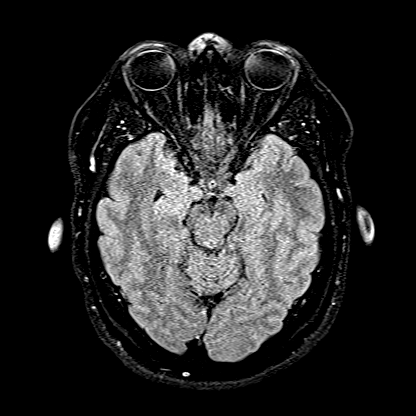
[im 37/55]
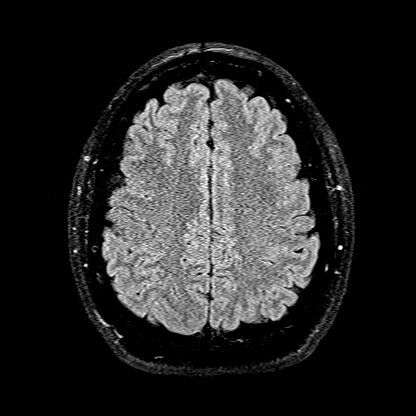
[im 55/55]
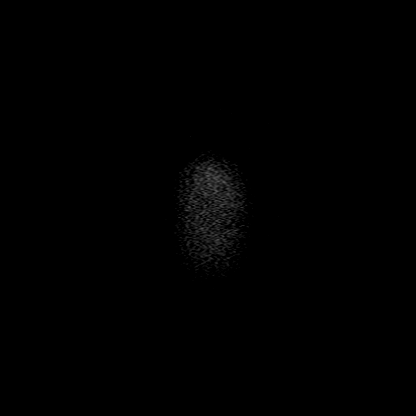

[Series 16: T1 · axial · 1.0mm · 0.98mm/px · z∈[-129,+43]mm · 11 of 176 slices shown (2 of 2)]
[im 1/176]
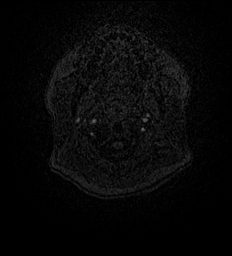
[im 15/176]
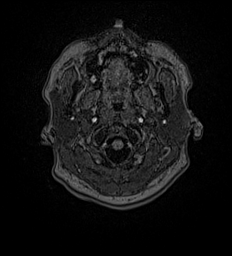
[im 30/176]
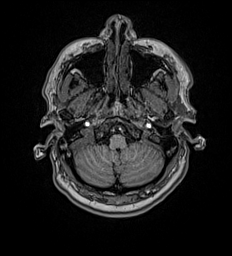
[im 44/176]
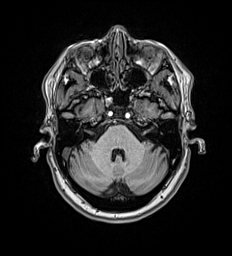
[im 59/176]
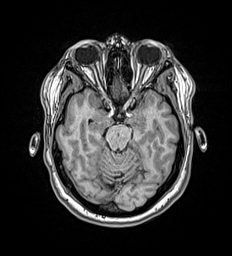
[im 73/176]
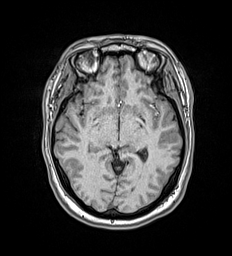
[im 88/176]
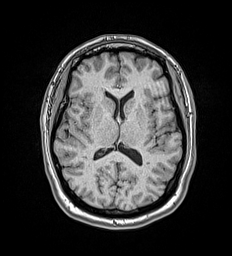
[im 103/176]
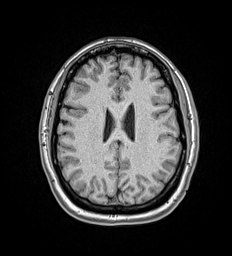
[im 117/176]
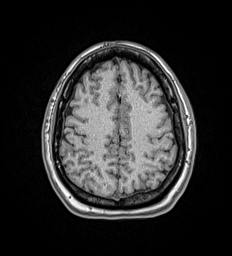
[im 146/176]
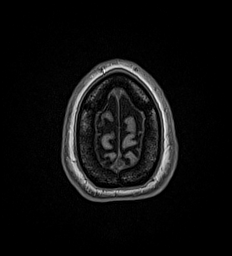
[im 176/176]
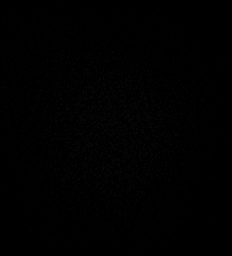

[Series 17: T2 · coronal · 5.0mm · 0.57mm/px · 2 of 29 slices shown (2 of 2)]
[im 1/29]
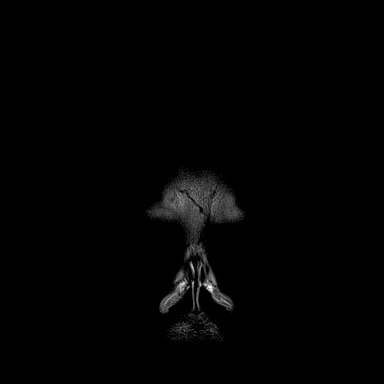
[im 29/29]
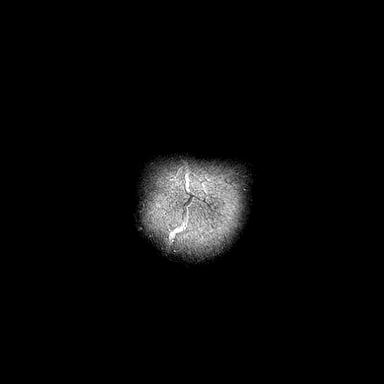

[46 of 48 positions shown; findings below may reference images not displayed]

FINDINGS: Brain: No acute infarction, hemorrhage, hydrocephalus, extra-axial
collection or mass lesion. There are a few remote cerebral white
matter insults, commonly seen to this degree in patients of this
age. These could be from old microvascular insults, trauma,
inflammation, or complicated migraine. Brain volume is normal.

Vascular: Normal flow voids

Skull and upper cervical spine: Normal marrow signal

Sinuses/Orbits: Negative
IMPRESSION: Negative for acute infarct and no specific explanation for headache.

## 2019-08-26 MED ORDER — INSULIN ASPART 100 UNIT/ML ~~LOC~~ SOLN: 0.0000 [IU] | Freq: Every day | SUBCUTANEOUS | Status: DC

## 2019-08-26 MED ORDER — ACETAMINOPHEN 160 MG/5ML PO SOLN
650.0000 mg | ORAL | Status: DC | PRN
  Filled 2019-08-26: qty 20.3

## 2019-08-26 MED ORDER — STROKE: EARLY STAGES OF RECOVERY BOOK: Freq: Once | Status: AC

## 2019-08-26 MED ORDER — NORTRIPTYLINE HCL 25 MG PO CAPS: 25.0000 mg | ORAL_CAPSULE | Freq: Every day | ORAL | 0 refills | Status: DC

## 2019-08-26 MED ORDER — ACETAMINOPHEN 325 MG PO TABS
650.0000 mg | ORAL_TABLET | ORAL | Status: DC | PRN
  Administered 2019-08-26 (×2): 650 mg via ORAL
  Filled 2019-08-26 (×2): qty 2

## 2019-08-26 MED ORDER — ACETAMINOPHEN 650 MG RE SUPP: 650.0000 mg | RECTAL | Status: DC | PRN

## 2019-08-26 MED ORDER — TELMISARTAN 40 MG PO TABS: 20.0000 mg | ORAL_TABLET | Freq: Every day | ORAL | 2 refills | Status: AC

## 2019-08-26 MED ORDER — ENOXAPARIN SODIUM 40 MG/0.4ML ~~LOC~~ SOLN
40.0000 mg | SUBCUTANEOUS | Status: DC
  Administered 2019-08-26: 40 mg via SUBCUTANEOUS
  Filled 2019-08-26: qty 0.4

## 2019-08-26 MED ORDER — ATORVASTATIN CALCIUM 80 MG PO TABS: 80.0000 mg | ORAL_TABLET | Freq: Every evening | ORAL | Status: DC

## 2019-08-26 MED ORDER — MAGNESIUM SULFATE 2 GM/50ML IV SOLN
2.0000 g | Freq: Once | INTRAVENOUS | Status: AC
  Administered 2019-08-26: 2 g via INTRAVENOUS
  Filled 2019-08-26: qty 50

## 2019-08-26 MED ORDER — INSULIN ASPART 100 UNIT/ML ~~LOC~~ SOLN
0.0000 [IU] | Freq: Three times a day (TID) | SUBCUTANEOUS | Status: DC
  Administered 2019-08-26: 5 [IU] via SUBCUTANEOUS
  Filled 2019-08-26: qty 1

## 2019-08-26 MED ORDER — ASPIRIN EC 81 MG PO TBEC: 81.0000 mg | DELAYED_RELEASE_TABLET | Freq: Every day | ORAL | Status: DC

## 2019-08-26 MED ORDER — KETOROLAC TROMETHAMINE 15 MG/ML IJ SOLN: 15.0000 mg | Freq: Once | INTRAMUSCULAR | Status: DC

## 2019-08-26 MED ORDER — LIVING WELL WITH DIABETES BOOK
Freq: Once | Status: DC
  Filled 2019-08-26: qty 1

## 2019-08-26 MED ORDER — ASPIRIN 81 MG PO CHEW
324.0000 mg | CHEWABLE_TABLET | Freq: Every day | ORAL | Status: DC
  Administered 2019-08-26: 324 mg via ORAL
  Filled 2019-08-26: qty 4

## 2019-08-26 MED ORDER — SODIUM CHLORIDE 0.9 % IV SOLN: INTRAVENOUS | Status: DC

## 2019-08-26 MED ORDER — SENNOSIDES-DOCUSATE SODIUM 8.6-50 MG PO TABS: 1.0000 | ORAL_TABLET | Freq: Every day | ORAL | Status: DC

## 2019-08-26 MED ORDER — ACETAMINOPHEN 325 MG PO TABS: 650.0000 mg | ORAL_TABLET | Freq: Four times a day (QID) | ORAL | Status: DC | PRN

## 2019-08-26 MED ORDER — PROCHLORPERAZINE EDISYLATE 10 MG/2ML IJ SOLN
5.0000 mg | Freq: Four times a day (QID) | INTRAMUSCULAR | Status: DC | PRN
  Filled 2019-08-26: qty 1

## 2019-08-26 NOTE — Progress Notes (Signed)
*  PRELIMINARY RESULTS* Echocardiogram 2D Echocardiogram has been performed. Bubble study has been performed. Jody Gonzalez Edrie Ehrich 08/26/2019, 9:38 AM

## 2019-08-26 NOTE — Progress Notes (Signed)
OT Cancellation Note  Patient Details Name: Jody Gonzalez MRN: [id number] DOB: Oct 04, 1970   Cancelled Treatment:    Reason Eval/Treat Not Completed: OT screened, no needs identified, will sign off  Upon speaking with patient and physical therapist, pt is moving well, performing self care I'ly with no AD. Demos good balance and good safety awareness. In addition, aphasia appears to be resolved. No acute OT needs identified at this time. Will s/o and complete order. Please re-consult for any change in patient status warranting OT services. Thank you.  Rejeana Brock, MS, OTR/L ascom [phone] 08/26/19, 1:41 PM

## 2019-08-26 NOTE — Progress Notes (Signed)
SLP Cancellation Note  Patient Details Name: Jody Gonzalez MRN: [id number] DOB: [birth year]   Cancelled treatment:       Reason Eval/Treat Not Completed: Patient at procedure or test/unavailable(pt is at MRI. ). Chart reviewed. Pt presented to the ED yesterday late PM w/ c/o "some trouble finding words and reports headache". NIHSS Score: 1 then. Symptoms improving per chart notes. ST services will f/u this PM or in the AM post completion of tests.     Jerilynn Som, MS, CCC-SLP Watson,Katherine 08/26/2019, 10:40 AM

## 2019-08-26 NOTE — H&P (Signed)
History and Physical    [name]:[id number] DOB: [birth year] DOA: 08/25/2019  PCP: Barron Alvine, MD    Patient coming from: Home    Chief Complaint: Headache, chest pain, expressive aphasia  HPI: Jody Gonzalez is a 49 y.o. female with medical history significant of hypertension, diabetes mellitus type 2, hypercholesterolemia, migraine came with a chief complaint word finding difficulty started around noon.  Expressive aphasia got worse around 3:00 PM.  The word finding difficulty were associated with headache.  Patient has history of migraines which usually gets better with ibuprofen and Tylenol  Patient initially developed some headache and chest pain around 10 AM. Took nitro with relief of chest pain, headache ongoing. ED Course:  On arrival to the ED she complains of headache. Does have hx of headaches vs migraine, but has never had associated neurological symptoms with them. She is able to get words out correctly but seems to have to focus significantly to get them out. She feels this has been getting slightly better since onset. Husband at bedside says speech is not as crisp/clear as it normally is.  No associated facial droop, extremity weakness, numbness.  She reports a frontal headache, moderate in severity. No radiation.  No alleviating or aggravating components.  Last time she was seen definitively normal was around 1 AM by husband.  At the time of my examination she was almost back to normal.  Neurology was consulted no indication for TPA outside the window patient getting better Was raised differential diagnosis with complex migraine CT of the head no contrast negative  CTA head and neckCTA neck: The bilateral common carotid, internal carotid and vertebral arteries are patent within the neck without stenosis.  CTA head: No intracranial large vessel occlusion or proximal high-grade arterial stenosis.  CT perfusion head: The perfusion software identifies no  core infarct. The perfusion software identifies no region of critically hypoperfused parenchyma utilizing the region Tmax>6 seconds threshold  Was given aspirin 325 mg p.o. and Compazine IV fluids and Benadryl for headache  Review of Systems: As per HPI otherwise 10 point review of systems negative.  Except chest pain, headache, expressive aphasia  Past Medical History:  Diagnosis Date  . Diabetes mellitus without complication (HCC)   . Hyperlipidemia   . Hypertension   . Type 2 diabetes mellitus (HCC)     History reviewed. No pertinent surgical history.   reports that she has never smoked. She has never used smokeless tobacco. She reports current alcohol use of about 2.0 standard drinks of alcohol per week. She reports that she does not use drugs.  Allergies  Allergen Reactions  . Azithromycin   . Imitrex [Sumatriptan]   . Lisinopril Cough    Family History  Problem Relation Age of Onset  . Diabetes Mother   . Heart attack Mother 60  . Hyperlipidemia Father   . Hypertension Father   . Heart attack Sister 76     Prior to Admission medications   Medication Sig Start Date End Date Taking? Authorizing Provider  aspirin EC 81 MG tablet Take 1 tablet (81 mg total) by mouth daily. 07/14/19  Yes End, Cristal Deer, MD  atorvastatin (LIPITOR) 80 MG tablet Take 80 mg by mouth every evening.    Yes [provider]  Conj Estrog-Medroxyprogest Ace (PREMPRO PO) Take 1 tablet by mouth daily.   Yes [provider]  Exenatide ER (BYDUREON BCISE) 2 MG/0.85ML AUIJ Inject 2 mg into the skin every Saturday.    Yes [provider]  metFORMIN (GLUCOPHAGE) 1000 MG tablet Take 1 tablet (1,000 mg total) by mouth 2 (two) times daily with a meal. 06/19/19 06/18/20 Yes Emily Filbert, MD  Multiple Vitamins-Minerals (HAIR SKIN AND NAILS FORMULA) TABS Take 1 tablet by mouth as directed.   Yes [provider]  nitroGLYCERIN (NITROSTAT) 0.4 MG SL tablet Place 1  tablet (0.4 mg total) under the tongue every 5 (five) minutes as needed for chest pain. Maximum of 3 doses. 07/14/19 10/12/19 Yes End, Cristal Deer, MD  telmisartan (MICARDIS) 40 MG tablet Take 1 tablet (40 mg total) by mouth daily. 06/19/19  Yes Emily Filbert, MD    Physical Exam: Vitals:   08/25/19 1800 08/25/19 1900 08/25/19 2000 08/25/19 2155  BP: 119/79 127/79 126/83 115/88  Pulse: 71 67 77 74  Resp: 16 17 17    Temp:    98.6 F (37 C)  TempSrc:    Oral  SpO2: 96% 96% 96% 99%  Weight:      Height:        Constitutional: NAD, calm, comfortable Vitals:   08/25/19 1800 08/25/19 1900 08/25/19 2000 08/25/19 2155  BP: 119/79 127/79 126/83 115/88  Pulse: 71 67 77 74  Resp: 16 17 17    Temp:    98.6 F (37 C)  TempSrc:    Oral  SpO2: 96% 96% 96% 99%  Weight:      Height:       Eyes: PERRL, lids and conjunctivae normal ENMT: Mucous membranes are moist. Posterior pharynx clear of any exudate or lesions.Normal dentition.  Neck: normal, supple, no masses, no thyromegaly Respiratory: clear to auscultation bilaterally, no wheezing, no crackles. Normal respiratory effort. No accessory muscle use.  Cardiovascular: Regular rate and rhythm, no murmurs / rubs / gallops. No extremity edema. 2+ pedal pulses. No carotid bruits.  Abdomen: no tenderness, no masses palpated. No hepatosplenomegaly. Bowel sounds positive.  Musculoskeletal: no clubbing / cyanosis. No joint deformity upper and lower extremities. Good ROM, no contractures. Normal muscle tone.  Skin: no rashes, lesions, ulcers. No induration Neurologic: CN 2-12 grossly intact. Sensation intact, DTR normal. Strength 5/5 in all 4.  Psychiatric: Normal judgment and insight. Alert and oriented x 3. Normal mood.    Labs on Admission: I have personally reviewed following labs and imaging studies  CBC: Recent Labs  Lab 08/25/19 1602  WBC 7.2  NEUTROABS 4.0  HGB 15.1*  HCT 43.0  MCV 90.5  PLT 249   Basic Metabolic  Panel: Recent Labs  Lab 08/25/19 1602  NA 137  K 3.8  CL 100  CO2 28  GLUCOSE 293*  BUN 7  CREATININE 0.61  CALCIUM 9.2   GFR: Estimated Creatinine Clearance: 80.4 mL/min (by C-G formula based on SCr of 0.61 mg/dL). Liver Function Tests: Recent Labs  Lab 08/25/19 1602  AST 25  ALT 42  ALKPHOS 103  BILITOT 0.8  PROT 6.8  ALBUMIN 4.4   No results for input(s): LIPASE, AMYLASE in the last 168 hours. No results for input(s): AMMONIA in the last 168 hours. Coagulation Profile: Recent Labs  Lab 08/25/19 1602  INR 0.8   Cardiac Enzymes: No results for input(s): CKTOTAL, CKMB, CKMBINDEX, TROPONINI in the last 168 hours. BNP (last 3 results) No results for input(s): PROBNP in the last 8760 hours. HbA1C: No results for input(s): HGBA1C in the last 72 hours. CBG: Recent Labs  Lab 08/25/19 1559  GLUCAP 294*   Lipid Profile: No results for input(s): CHOL, HDL, LDLCALC, TRIG, CHOLHDL, LDLDIRECT  in the last 72 hours. Thyroid Function Tests: No results for input(s): TSH, T4TOTAL, FREET4, T3FREE, THYROIDAB in the last 72 hours. Anemia Panel: No results for input(s): VITAMINB12, FOLATE, FERRITIN, TIBC, IRON, RETICCTPCT in the last 72 hours. Urine analysis: No results found for: COLORURINE, APPEARANCEUR, LABSPEC, PHURINE, GLUCOSEU, HGBUR, BILIRUBINUR, KETONESUR, PROTEINUR, UROBILINOGEN, NITRITE, LEUKOCYTESUR  Radiological Exams on Admission: CT ANGIO HEAD W OR WO CONTRAST  Addendum Date: 08/25/2019   ADDENDUM REPORT: 08/25/2019 22:05 ADDENDUM: CTA head/neck and CT perfusion head findings were called by telephone at the time of interpretation on 08/25/2019 at 6:11 p.m. pm to provider Dr. Colon Branch, who verbally acknowledged these results. Electronically Signed   By: Jackey Loge DO   On: 08/25/2019 22:05   Result Date: 08/25/2019 CLINICAL DATA:  Neuro deficit, acute, stroke suspected. Additional history provided: Acute onset word finding difficulty. EXAM: CT ANGIOGRAPHY HEAD AND  NECK CT PERFUSION BRAIN TECHNIQUE: Multidetector CT imaging of the head and neck was performed using the standard protocol during bolus administration of intravenous contrast. Multiplanar CT image reconstructions and MIPs were obtained to evaluate the vascular anatomy. Carotid stenosis measurements (when applicable) are obtained utilizing NASCET criteria, using the distal internal carotid diameter as the denominator. Multiphase CT imaging of the brain was performed following IV bolus contrast injection. Subsequent parametric perfusion maps were calculated using RAPID software. CONTRAST:  OMNIPAQUE IOHEXOL 350 MG/ML SOLN COMPARISON:  Noncontrast head CT performed earlier the same day 08/25/2019. FINDINGS: CT HEAD FINDINGS Brain: No evidence of acute intracranial hemorrhage. No demarcated cortical infarction. No evidence of intracranial mass. No midline shift or extra-axial fluid collection. Minimal scattered ill-defined hypoattenuation within the cerebral white matter is nonspecific, but consistent with chronic small vessel ischemic disease. Cerebral volume is normal for age. Vascular: Reported separately. Skull: Normal. Negative for fracture or focal lesion. Sinuses/Orbits: Visualized orbits demonstrate no acute abnormality. No significant paranasal sinus disease or mastoid effusion at the imaged levels. CTA NECK FINDINGS Aortic arch: Standard aortic branching. The visualized aortic arch is unremarkable. No significant innominate or proximal subclavian artery stenosis. Right carotid system: CCA and ICA patent within the neck without stenosis. Left carotid system: CCA and ICA patent within the neck without stenosis. Vertebral arteries: Codominant. The vertebral arteries are patent within the neck without stenosis. Skeleton: No acute bony abnormality. Mild cervical spondylosis. No high-grade bony spinal canal stenosis Other neck: No neck mass or cervical lymphadenopathy. Upper chest: No consolidation within the  imaged lung apices. Review of the MIP images confirms the above findings CTA HEAD FINDINGS Anterior circulation: The intracranial internal carotid arteries are patent without stenosis. The M1 middle cerebral arteries are patent without significant stenosis. No M2 proximal branch occlusion or high-grade proximal stenosis is identified. The anterior cerebral arteries are patent without high-grade proximal stenosis. No intracranial aneurysm is identified. Posterior circulation: The intracranial vertebral arteries are patent without significant stenosis, as is the basilar artery. The bilateral posterior cerebral arteries are patent without significant proximal stenosis. Posterior communicating arteries are poorly delineated and may be hypoplastic or absent bilaterally. Venous sinuses: Within limitations of contrast timing, no convincing thrombus. Anatomic variants: As described Review of the MIP images confirms the above findings CT Brain Perfusion Findings: CBF (<30%) Volume: 0 ML Perfusion (Tmax>6.0s) volume: 15mL Mismatch Volume: 72mL Infarction Location:None identified IMPRESSION: CT head: 1. No evidence of acute intracranial abnormality. 2. Minimal scattered ill-defined hypoattenuation within the cerebral white matter is nonspecific, but consistent with chronic small vessel ischemic disease. CTA neck: The bilateral common carotid,  internal carotid and vertebral arteries are patent within the neck without stenosis. CTA head: No intracranial large vessel occlusion or proximal high-grade arterial stenosis. CT perfusion head: The perfusion software identifies no core infarct. The perfusion software identifies no region of critically hypoperfused parenchyma utilizing the region Tmax>6 seconds threshold. Electronically Signed: By: Jackey Loge DO On: 08/25/2019 18:04   CT ANGIO NECK W OR WO CONTRAST  Addendum Date: 08/25/2019   ADDENDUM REPORT: 08/25/2019 22:05 ADDENDUM: CTA head/neck and CT perfusion head findings  were called by telephone at the time of interpretation on 08/25/2019 at 6:11 p.m. pm to provider Dr. Colon Branch, who verbally acknowledged these results. Electronically Signed   By: Jackey Loge DO   On: 08/25/2019 22:05   Result Date: 08/25/2019 CLINICAL DATA:  Neuro deficit, acute, stroke suspected. Additional history provided: Acute onset word finding difficulty. EXAM: CT ANGIOGRAPHY HEAD AND NECK CT PERFUSION BRAIN TECHNIQUE: Multidetector CT imaging of the head and neck was performed using the standard protocol during bolus administration of intravenous contrast. Multiplanar CT image reconstructions and MIPs were obtained to evaluate the vascular anatomy. Carotid stenosis measurements (when applicable) are obtained utilizing NASCET criteria, using the distal internal carotid diameter as the denominator. Multiphase CT imaging of the brain was performed following IV bolus contrast injection. Subsequent parametric perfusion maps were calculated using RAPID software. CONTRAST:  OMNIPAQUE IOHEXOL 350 MG/ML SOLN COMPARISON:  Noncontrast head CT performed earlier the same day 08/25/2019. FINDINGS: CT HEAD FINDINGS Brain: No evidence of acute intracranial hemorrhage. No demarcated cortical infarction. No evidence of intracranial mass. No midline shift or extra-axial fluid collection. Minimal scattered ill-defined hypoattenuation within the cerebral white matter is nonspecific, but consistent with chronic small vessel ischemic disease. Cerebral volume is normal for age. Vascular: Reported separately. Skull: Normal. Negative for fracture or focal lesion. Sinuses/Orbits: Visualized orbits demonstrate no acute abnormality. No significant paranasal sinus disease or mastoid effusion at the imaged levels. CTA NECK FINDINGS Aortic arch: Standard aortic branching. The visualized aortic arch is unremarkable. No significant innominate or proximal subclavian artery stenosis. Right carotid system: CCA and ICA patent within the  neck without stenosis. Left carotid system: CCA and ICA patent within the neck without stenosis. Vertebral arteries: Codominant. The vertebral arteries are patent within the neck without stenosis. Skeleton: No acute bony abnormality. Mild cervical spondylosis. No high-grade bony spinal canal stenosis Other neck: No neck mass or cervical lymphadenopathy. Upper chest: No consolidation within the imaged lung apices. Review of the MIP images confirms the above findings CTA HEAD FINDINGS Anterior circulation: The intracranial internal carotid arteries are patent without stenosis. The M1 middle cerebral arteries are patent without significant stenosis. No M2 proximal branch occlusion or high-grade proximal stenosis is identified. The anterior cerebral arteries are patent without high-grade proximal stenosis. No intracranial aneurysm is identified. Posterior circulation: The intracranial vertebral arteries are patent without significant stenosis, as is the basilar artery. The bilateral posterior cerebral arteries are patent without significant proximal stenosis. Posterior communicating arteries are poorly delineated and may be hypoplastic or absent bilaterally. Venous sinuses: Within limitations of contrast timing, no convincing thrombus. Anatomic variants: As described Review of the MIP images confirms the above findings CT Brain Perfusion Findings: CBF (<30%) Volume: 0 ML Perfusion (Tmax>6.0s) volume: 33mL Mismatch Volume: 74mL Infarction Location:None identified IMPRESSION: CT head: 1. No evidence of acute intracranial abnormality. 2. Minimal scattered ill-defined hypoattenuation within the cerebral white matter is nonspecific, but consistent with chronic small vessel ischemic disease. CTA neck: The bilateral common carotid,  internal carotid and vertebral arteries are patent within the neck without stenosis. CTA head: No intracranial large vessel occlusion or proximal high-grade arterial stenosis. CT perfusion head: The  perfusion software identifies no core infarct. The perfusion software identifies no region of critically hypoperfused parenchyma utilizing the region Tmax>6 seconds threshold. Electronically Signed: By: Kellie Simmering DO On: 08/25/2019 18:04   CT CEREBRAL PERFUSION W CONTRAST  Addendum Date: 08/25/2019   ADDENDUM REPORT: 08/25/2019 22:05 ADDENDUM: CTA head/neck and CT perfusion head findings were called by telephone at the time of interpretation on 08/25/2019 at 6:11 p.m. pm to provider Dr. Joan Mayans, who verbally acknowledged these results. Electronically Signed   By: Kellie Simmering DO   On: 08/25/2019 22:05   Result Date: 08/25/2019 CLINICAL DATA:  Neuro deficit, acute, stroke suspected. Additional history provided: Acute onset word finding difficulty. EXAM: CT ANGIOGRAPHY HEAD AND NECK CT PERFUSION BRAIN TECHNIQUE: Multidetector CT imaging of the head and neck was performed using the standard protocol during bolus administration of intravenous contrast. Multiplanar CT image reconstructions and MIPs were obtained to evaluate the vascular anatomy. Carotid stenosis measurements (when applicable) are obtained utilizing NASCET criteria, using the distal internal carotid diameter as the denominator. Multiphase CT imaging of the brain was performed following IV bolus contrast injection. Subsequent parametric perfusion maps were calculated using RAPID software. CONTRAST:  112mL OMNIPAQUE IOHEXOL 350 MG/ML SOLN COMPARISON:  Noncontrast head CT performed earlier the same day 08/25/2019. FINDINGS: CT HEAD FINDINGS Brain: No evidence of acute intracranial hemorrhage. No demarcated cortical infarction. No evidence of intracranial mass. No midline shift or extra-axial fluid collection. Minimal scattered ill-defined hypoattenuation within the cerebral white matter is nonspecific, but consistent with chronic small vessel ischemic disease. Cerebral volume is normal for age. Vascular: Reported separately. Skull: Normal. Negative for  fracture or focal lesion. Sinuses/Orbits: Visualized orbits demonstrate no acute abnormality. No significant paranasal sinus disease or mastoid effusion at the imaged levels. CTA NECK FINDINGS Aortic arch: Standard aortic branching. The visualized aortic arch is unremarkable. No significant innominate or proximal subclavian artery stenosis. Right carotid system: CCA and ICA patent within the neck without stenosis. Left carotid system: CCA and ICA patent within the neck without stenosis. Vertebral arteries: Codominant. The vertebral arteries are patent within the neck without stenosis. Skeleton: No acute bony abnormality. Mild cervical spondylosis. No high-grade bony spinal canal stenosis Other neck: No neck mass or cervical lymphadenopathy. Upper chest: No consolidation within the imaged lung apices. Review of the MIP images confirms the above findings CTA HEAD FINDINGS Anterior circulation: The intracranial internal carotid arteries are patent without stenosis. The M1 middle cerebral arteries are patent without significant stenosis. No M2 proximal branch occlusion or high-grade proximal stenosis is identified. The anterior cerebral arteries are patent without high-grade proximal stenosis. No intracranial aneurysm is identified. Posterior circulation: The intracranial vertebral arteries are patent without significant stenosis, as is the basilar artery. The bilateral posterior cerebral arteries are patent without significant proximal stenosis. Posterior communicating arteries are poorly delineated and may be hypoplastic or absent bilaterally. Venous sinuses: Within limitations of contrast timing, no convincing thrombus. Anatomic variants: As described Review of the MIP images confirms the above findings CT Brain Perfusion Findings: CBF (<30%) Volume: 0 ML Perfusion (Tmax>6.0s) volume: 73mL Mismatch Volume: 36mL Infarction Location:None identified IMPRESSION: CT head: 1. No evidence of acute intracranial abnormality. 2.  Minimal scattered ill-defined hypoattenuation within the cerebral white matter is nonspecific, but consistent with chronic small vessel ischemic disease. CTA neck: The bilateral common carotid, internal  carotid and vertebral arteries are patent within the neck without stenosis. CTA head: No intracranial large vessel occlusion or proximal high-grade arterial stenosis. CT perfusion head: The perfusion software identifies no core infarct. The perfusion software identifies no region of critically hypoperfused parenchyma utilizing the region Tmax>6 seconds threshold. Electronically Signed: By: Jackey LogeKyle  Golden DO On: 08/25/2019 18:04   CT HEAD CODE STROKE WO CONTRAST  Result Date: 08/25/2019 CLINICAL DATA:  Code stroke.  TIA.  Headache and chest pain EXAM: CT HEAD WITHOUT CONTRAST TECHNIQUE: Contiguous axial images were obtained from the base of the skull through the vertex without intravenous contrast. COMPARISON:  None. FINDINGS: Brain: No evidence of acute infarction, hemorrhage, hydrocephalus, extra-axial collection or mass lesion/mass effect. Vascular: Negative for hyperdense vessel Skull: Negative Sinuses/Orbits: Negative Other: None ASPECTS ([place] Stroke Program Early CT Score) - Ganglionic level infarction (caudate, lentiform nuclei, internal capsule, insula, M1-M3 cortex): 7 - Supraganglionic infarction (M4-M6 cortex): 3 Total score (0-10 with 10 being normal): 10 IMPRESSION: 1. Negative CT head 2. ASPECTS is 10 3. These results were called by telephone at the time of interpretation on 08/25/2019 at 4:15 pm [facility] , who verbally acknowledged these results. Electronically Signed   By: Marlan Palauharles  Clark M.D.   On: 08/25/2019 16:15    EKG: Independently reviewed.  Normal sinus no acute ST-T changes  Assessment plan  TIA versus complex migraine Patient who presents with headache and word finding difficulty, expressive aphasia started at noon.  At 3:00 talk to her coworker and the expressive  aphasia was significantly worse The changes were associated with severe headache.  Pain And has history of migraine Emergency room physician consulted neurology No indication for TPA outside the window and patient is getting much better CT of the head without contrast negative for acute pathology CTA head and neck without signs of significant stenosis Plan echocardiogram, MRI in the morning, aspirin, lipid profile, IV fluids, Lipitor  Diabetes mellitus type 2 Hold p.o. medication Insulin sliding scale  Essential hypertension  Micardis on hold allow permissive hypertension for 24 hours  Hypercholesterolemia Resume Lipitor  History of migraine Patient usually takes Tylenol and ibuprofen with relief  Chest pain Looks atypical Electrocardiogram no acute ST-T changes First 2 troponins, negative, follow with cardiology  Assessment/Plan Active Problems:   Essential hypertension   Hyperlipidemia LDL goal <70   DM type 2 (diabetes mellitus, type 2) (HCC)   Migraine   TIA (transient ischemic attack)      DVT prophylaxis: Lovenox Code Status: Full code Family Communication: Husband in the room Disposition Plan: Home Consults called: By emergency room, neurology consult Admission status: Observation   Teren Zurcher G Jenayah Antu MD [facility]  If 7PM-7AM, please contact night-coverage [email or web address]   08/26/2019, 2:18 AM

## 2019-08-26 NOTE — Consult Note (Addendum)
Requesting Physician: Dr. Earleen Newport     Chief Complaint: Headache, difficulty getting words out  History obtained from: Patient and Chart     HPI:                                                                                                                                       Jody Gonzalez is a 49 y.o. female with past medical history significant for hypertension, hyperlipidemia, diabetes mellitus, headaches presents to the emergency department yesterday around 4 PM after her husband brought her for difficulty getting words out.  Code stroke was activated on arrival.  Last known normal unclear, with word finding difficulty around 12 PM.  However, patient states she woke up with a headache which became worse around 10 AM.  Around 12 PM she had difficulty getting words out and slower thought process.  Around 1:30 PM she noticed some chest pain and decided to take some nitrate.  Her husband saw her around 4 PM and noticed she had difficulty thinking of words.  He states that she would speak appropriately but it would take much longer to complete sentences.  No facial droop.  Patient denies any sensory abnormalities or weakness.  She has a history of headaches that she characterizes as unilateral behind the eye, throbbing, usually on the right side but can occur on the left, associated with nausea and photophobia, can last 2 to 3 days and is relieved by rest and Excedrin. Not formally diagnosed with migraines.  Headaches have been going on for several years, occur approximately 2-3 times per week.  ED course -Tele neurology was consulted for code stroke, felt this was either complex migraine versus TIA.  Patient underwent CT head which is unremarkable and CT angiogram of head and neck which also did not show any significant abnormality.  Did not receive TPA as outside TPA window as it was a mild symptoms.  Neurology consulted today for further evaluation.  Past Medical History:  Diagnosis Date  .  Diabetes mellitus without complication ([place])   . Hyperlipidemia   . Hypertension   . Type 2 diabetes mellitus ([place])     History reviewed. No pertinent surgical history.  Family History  Problem Relation Age of Onset  . Diabetes Mother   . Heart attack Mother 8  . Hyperlipidemia Father   . Hypertension Father   . Heart attack Sister 44   Social History:  reports that she has never smoked. She has never used smokeless tobacco. She reports current alcohol use of about 2.0 standard drinks of alcohol per week. She reports that she does not use drugs.  Allergies:  Allergies  Allergen Reactions  . Azithromycin   . Imitrex [Sumatriptan]   . Lisinopril Cough    Medications:  I reviewed home medications   ROS:                                                                                                                                     14 systems reviewed and negative except above   Examination:                                                                                                      General: Appears well-developed and well-nourished.  Psych: Affect appropriate to situation Eyes: No scleral injection HENT: No OP obstrucion Head: Normocephalic.  Cardiovascular: Normal rate and regular rhythm.  Respiratory: Effort normal and breath sounds normal to anterior ascultation GI: Soft.  No distension. There is no tenderness.  Skin: WDI    Neurological Examination Mental Status: Alert, oriented, thought content appropriate.  Speech fluent without evidence of aphasia. Able to follow 3 step commands without difficulty. Cranial Nerves: II: Visual fields grossly normal,  III,IV, VI: ptosis not present, extra-ocular motions intact bilaterally, pupils equal, round, reactive to light and accommodation V,VII: smile symmetric, facial light touch sensation  normal bilaterally VIII: hearing normal bilaterally IX,X: uvula rises symmetrically XI: bilateral shoulder shrug XII: midline tongue extension Motor: Right : Upper extremity   5/5    Left:     Upper extremity   5/5  Lower extremity   5/5     Lower extremity   5/5 Tone and bulk:normal tone throughout; no atrophy noted Sensory: Pinprick and light touch intact throughout, bilaterally Deep Tendon Reflexes: 2+ and symmetric throughout Plantars: Right: downgoing   Left: downgoing Cerebellar: normal finger-to-nose, normal rapid alternating movements and normal heel-to-shin test Gait: normal gait and station     Lab Results: Basic Metabolic Panel: Recent Labs  Lab 08/25/19 1602 08/26/19 0554  NA 137 137  K 3.8 3.8  CL 100 103  CO2 28 26  GLUCOSE 293* 226*  BUN 7 8  CREATININE 0.61 0.58  CALCIUM 9.2 8.3*    CBC: Recent Labs  Lab 08/25/19 1602 08/26/19 0554  WBC 7.2 5.9  NEUTROABS 4.0  --   HGB 15.1* 13.4  HCT 43.0 38.8  MCV 90.5 92.4  PLT 249 199    Coagulation Studies: Recent Labs    08/25/19 1602  LABPROT 11.3*  INR 0.8    Imaging: CT ANGIO HEAD W OR WO CONTRAST  Addendum Date: 08/25/2019   ADDENDUM REPORT: 08/25/2019 22:05 ADDENDUM: CTA head/neck and CT perfusion head findings were called by telephone at  the time of interpretation on 08/25/2019 at 6:11 p.m. pm to provider Dr. Colon Branch, who verbally acknowledged these results. Electronically Signed   By: Jackey Loge DO   On: 08/25/2019 22:05   Result Date: 08/25/2019 CLINICAL DATA:  Neuro deficit, acute, stroke suspected. Additional history provided: Acute onset word finding difficulty. EXAM: CT ANGIOGRAPHY HEAD AND NECK CT PERFUSION BRAIN TECHNIQUE: Multidetector CT imaging of the head and neck was performed using the standard protocol during bolus administration of intravenous contrast. Multiplanar CT image reconstructions and MIPs were obtained to evaluate the vascular anatomy. Carotid stenosis measurements (when  applicable) are obtained utilizing NASCET criteria, using the distal internal carotid diameter as the denominator. Multiphase CT imaging of the brain was performed following IV bolus contrast injection. Subsequent parametric perfusion maps were calculated using RAPID software. CONTRAST:  OMNIPAQUE IOHEXOL 350 MG/ML SOLN COMPARISON:  Noncontrast head CT performed earlier the same day 08/25/2019. FINDINGS: CT HEAD FINDINGS Brain: No evidence of acute intracranial hemorrhage. No demarcated cortical infarction. No evidence of intracranial mass. No midline shift or extra-axial fluid collection. Minimal scattered ill-defined hypoattenuation within the cerebral white matter is nonspecific, but consistent with chronic small vessel ischemic disease. Cerebral volume is normal for age. Vascular: Reported separately. Skull: Normal. Negative for fracture or focal lesion. Sinuses/Orbits: Visualized orbits demonstrate no acute abnormality. No significant paranasal sinus disease or mastoid effusion at the imaged levels. CTA NECK FINDINGS Aortic arch: Standard aortic branching. The visualized aortic arch is unremarkable. No significant innominate or proximal subclavian artery stenosis. Right carotid system: CCA and ICA patent within the neck without stenosis. Left carotid system: CCA and ICA patent within the neck without stenosis. Vertebral arteries: Codominant. The vertebral arteries are patent within the neck without stenosis. Skeleton: No acute bony abnormality. Mild cervical spondylosis. No high-grade bony spinal canal stenosis Other neck: No neck mass or cervical lymphadenopathy. Upper chest: No consolidation within the imaged lung apices. Review of the MIP images confirms the above findings CTA HEAD FINDINGS Anterior circulation: The intracranial internal carotid arteries are patent without stenosis. The M1 middle cerebral arteries are patent without significant stenosis. No M2 proximal branch occlusion or high-grade  proximal stenosis is identified. The anterior cerebral arteries are patent without high-grade proximal stenosis. No intracranial aneurysm is identified. Posterior circulation: The intracranial vertebral arteries are patent without significant stenosis, as is the basilar artery. The bilateral posterior cerebral arteries are patent without significant proximal stenosis. Posterior communicating arteries are poorly delineated and may be hypoplastic or absent bilaterally. Venous sinuses: Within limitations of contrast timing, no convincing thrombus. Anatomic variants: As described Review of the MIP images confirms the above findings CT Brain Perfusion Findings: CBF (<30%) Volume: 0 ML Perfusion (Tmax>6.0s) volume: 55mL Mismatch Volume: 59mL Infarction Location:None identified IMPRESSION: CT head: 1. No evidence of acute intracranial abnormality. 2. Minimal scattered ill-defined hypoattenuation within the cerebral white matter is nonspecific, but consistent with chronic small vessel ischemic disease. CTA neck: The bilateral common carotid, internal carotid and vertebral arteries are patent within the neck without stenosis. CTA head: No intracranial large vessel occlusion or proximal high-grade arterial stenosis. CT perfusion head: The perfusion software identifies no core infarct. The perfusion software identifies no region of critically hypoperfused parenchyma utilizing the region Tmax>6 seconds threshold. Electronically Signed: By: Jackey Loge DO On: 08/25/2019 18:04   CT ANGIO NECK W OR WO CONTRAST  Addendum Date: 08/25/2019   ADDENDUM REPORT: 08/25/2019 22:05 ADDENDUM: CTA head/neck and CT perfusion head findings were called by telephone at  the time of interpretation on 08/25/2019 at 6:11 p.m. pm to provider Dr. Colon BranchMonks, who verbally acknowledged these results. Electronically Signed   By: Jackey LogeKyle  Golden DO   On: 08/25/2019 22:05   Result Date: 08/25/2019 CLINICAL DATA:  Neuro deficit, acute, stroke suspected.  Additional history provided: Acute onset word finding difficulty. EXAM: CT ANGIOGRAPHY HEAD AND NECK CT PERFUSION BRAIN TECHNIQUE: Multidetector CT imaging of the head and neck was performed using the standard protocol during bolus administration of intravenous contrast. Multiplanar CT image reconstructions and MIPs were obtained to evaluate the vascular anatomy. Carotid stenosis measurements (when applicable) are obtained utilizing NASCET criteria, using the distal internal carotid diameter as the denominator. Multiphase CT imaging of the brain was performed following IV bolus contrast injection. Subsequent parametric perfusion maps were calculated using RAPID software. CONTRAST:  100mL OMNIPAQUE IOHEXOL 350 MG/ML SOLN COMPARISON:  Noncontrast head CT performed earlier the same day 08/25/2019. FINDINGS: CT HEAD FINDINGS Brain: No evidence of acute intracranial hemorrhage. No demarcated cortical infarction. No evidence of intracranial mass. No midline shift or extra-axial fluid collection. Minimal scattered ill-defined hypoattenuation within the cerebral white matter is nonspecific, but consistent with chronic small vessel ischemic disease. Cerebral volume is normal for age. Vascular: Reported separately. Skull: Normal. Negative for fracture or focal lesion. Sinuses/Orbits: Visualized orbits demonstrate no acute abnormality. No significant paranasal sinus disease or mastoid effusion at the imaged levels. CTA NECK FINDINGS Aortic arch: Standard aortic branching. The visualized aortic arch is unremarkable. No significant innominate or proximal subclavian artery stenosis. Right carotid system: CCA and ICA patent within the neck without stenosis. Left carotid system: CCA and ICA patent within the neck without stenosis. Vertebral arteries: Codominant. The vertebral arteries are patent within the neck without stenosis. Skeleton: No acute bony abnormality. Mild cervical spondylosis. No high-grade bony spinal canal stenosis  Other neck: No neck mass or cervical lymphadenopathy. Upper chest: No consolidation within the imaged lung apices. Review of the MIP images confirms the above findings CTA HEAD FINDINGS Anterior circulation: The intracranial internal carotid arteries are patent without stenosis. The M1 middle cerebral arteries are patent without significant stenosis. No M2 proximal branch occlusion or high-grade proximal stenosis is identified. The anterior cerebral arteries are patent without high-grade proximal stenosis. No intracranial aneurysm is identified. Posterior circulation: The intracranial vertebral arteries are patent without significant stenosis, as is the basilar artery. The bilateral posterior cerebral arteries are patent without significant proximal stenosis. Posterior communicating arteries are poorly delineated and may be hypoplastic or absent bilaterally. Venous sinuses: Within limitations of contrast timing, no convincing thrombus. Anatomic variants: As described Review of the MIP images confirms the above findings CT Brain Perfusion Findings: CBF (<30%) Volume: 0 ML Perfusion (Tmax>6.0s) volume: 0mL Mismatch Volume: 0mL Infarction Location:None identified IMPRESSION: CT head: 1. No evidence of acute intracranial abnormality. 2. Minimal scattered ill-defined hypoattenuation within the cerebral white matter is nonspecific, but consistent with chronic small vessel ischemic disease. CTA neck: The bilateral common carotid, internal carotid and vertebral arteries are patent within the neck without stenosis. CTA head: No intracranial large vessel occlusion or proximal high-grade arterial stenosis. CT perfusion head: The perfusion software identifies no core infarct. The perfusion software identifies no region of critically hypoperfused parenchyma utilizing the region Tmax>6 seconds threshold. Electronically Signed: By: Jackey LogeKyle  Golden DO On: 08/25/2019 18:04   MR BRAIN WO CONTRAST  Result Date: 08/26/2019 CLINICAL  DATA:  Transient ischemic attack.  Migraine. EXAM: MRI HEAD WITHOUT CONTRAST TECHNIQUE: Multiplanar, multiecho pulse sequences of the brain  and surrounding structures were obtained without intravenous contrast. COMPARISON:  None. FINDINGS: Brain: No acute infarction, hemorrhage, hydrocephalus, extra-axial collection or mass lesion. There are a few remote cerebral white matter insults, commonly seen to this degree in patients of this age. These could be from old microvascular insults, trauma, inflammation, or complicated migraine. Brain volume is normal. Vascular: Normal flow voids Skull and upper cervical spine: Normal marrow signal Sinuses/Orbits: Negative IMPRESSION: Negative for acute infarct and no specific explanation for headache. Electronically Signed   By: Marnee Spring M.D.   On: 08/26/2019 11:17   CT CEREBRAL PERFUSION W CONTRAST  Addendum Date: 08/25/2019   ADDENDUM REPORT: 08/25/2019 22:05 ADDENDUM: CTA head/neck and CT perfusion head findings were called by telephone at the time of interpretation on 08/25/2019 at 6:11 p.m. pm to provider Dr. Colon Branch, who verbally acknowledged these results. Electronically Signed   By: Jackey Loge DO   On: 08/25/2019 22:05   Result Date: 08/25/2019 CLINICAL DATA:  Neuro deficit, acute, stroke suspected. Additional history provided: Acute onset word finding difficulty. EXAM: CT ANGIOGRAPHY HEAD AND NECK CT PERFUSION BRAIN TECHNIQUE: Multidetector CT imaging of the head and neck was performed using the standard protocol during bolus administration of intravenous contrast. Multiplanar CT image reconstructions and MIPs were obtained to evaluate the vascular anatomy. Carotid stenosis measurements (when applicable) are obtained utilizing NASCET criteria, using the distal internal carotid diameter as the denominator. Multiphase CT imaging of the brain was performed following IV bolus contrast injection. Subsequent parametric perfusion maps were calculated using RAPID  software. CONTRAST:  OMNIPAQUE IOHEXOL 350 MG/ML SOLN COMPARISON:  Noncontrast head CT performed earlier the same day 08/25/2019. FINDINGS: CT HEAD FINDINGS Brain: No evidence of acute intracranial hemorrhage. No demarcated cortical infarction. No evidence of intracranial mass. No midline shift or extra-axial fluid collection. Minimal scattered ill-defined hypoattenuation within the cerebral white matter is nonspecific, but consistent with chronic small vessel ischemic disease. Cerebral volume is normal for age. Vascular: Reported separately. Skull: Normal. Negative for fracture or focal lesion. Sinuses/Orbits: Visualized orbits demonstrate no acute abnormality. No significant paranasal sinus disease or mastoid effusion at the imaged levels. CTA NECK FINDINGS Aortic arch: Standard aortic branching. The visualized aortic arch is unremarkable. No significant innominate or proximal subclavian artery stenosis. Right carotid system: CCA and ICA patent within the neck without stenosis. Left carotid system: CCA and ICA patent within the neck without stenosis. Vertebral arteries: Codominant. The vertebral arteries are patent within the neck without stenosis. Skeleton: No acute bony abnormality. Mild cervical spondylosis. No high-grade bony spinal canal stenosis Other neck: No neck mass or cervical lymphadenopathy. Upper chest: No consolidation within the imaged lung apices. Review of the MIP images confirms the above findings CTA HEAD FINDINGS Anterior circulation: The intracranial internal carotid arteries are patent without stenosis. The M1 middle cerebral arteries are patent without significant stenosis. No M2 proximal branch occlusion or high-grade proximal stenosis is identified. The anterior cerebral arteries are patent without high-grade proximal stenosis. No intracranial aneurysm is identified. Posterior circulation: The intracranial vertebral arteries are patent without significant stenosis, as is the basilar  artery. The bilateral posterior cerebral arteries are patent without significant proximal stenosis. Posterior communicating arteries are poorly delineated and may be hypoplastic or absent bilaterally. Venous sinuses: Within limitations of contrast timing, no convincing thrombus. Anatomic variants: As described Review of the MIP images confirms the above findings CT Brain Perfusion Findings: CBF (<30%) Volume: 0 ML Perfusion (Tmax>6.0s) volume: 49mL Mismatch Volume: 16mL Infarction Location:None identified IMPRESSION:  CT head: 1. No evidence of acute intracranial abnormality. 2. Minimal scattered ill-defined hypoattenuation within the cerebral white matter is nonspecific, but consistent with chronic small vessel ischemic disease. CTA neck: The bilateral common carotid, internal carotid and vertebral arteries are patent within the neck without stenosis. CTA head: No intracranial large vessel occlusion or proximal high-grade arterial stenosis. CT perfusion head: The perfusion software identifies no core infarct. The perfusion software identifies no region of critically hypoperfused parenchyma utilizing the region Tmax>6 seconds threshold. Electronically Signed: By: Jackey Loge DO On: 08/25/2019 18:04   ECHOCARDIOGRAM COMPLETE  Result Date: 08/26/2019    ECHOCARDIOGRAM REPORT   Patient Name:   Jody Gonzalez Date of Exam: 08/26/2019 Medical Rec #:  [id number]    Height:       63.0 in Accession #:    [id number]   Weight:       160.0 lb Date of Birth:  07-Jan-1971     BSA:          1.759 m Patient Age:    48 years     BP:           126/79 mmHg Patient Gender: F            HR:           79 bpm. Exam Location:  [facility] Procedure: 2D Echo, Color Doppler, Cardiac Doppler and Saline Contrast Bubble            Study Indications:     G45.9 TIA  History:         Patient has no prior history of Echocardiogram examinations.                  Risk Factors:Hypertension, Diabetes, Dyslipidemia and Family                  History of Coronary  Artery Disease.  Sonographer:     Humphrey Rolls RDCS (AE) Referring Phys:  [id number] Victorino Sparrow CRISTESCU Diagnosing Phys: Arnoldo Hooker MD  Sonographer Comments: Suboptimal apical window. IMPRESSIONS  1. Left ventricular ejection fraction, by estimation, is 55 to 60%. The left ventricle has normal function. The left ventricle has no regional wall motion abnormalities. Left ventricular diastolic parameters were normal.  2. Right ventricular systolic function is normal. The right ventricular size is normal.  3. The mitral valve is normal in structure and function. Trivial mitral valve regurgitation.  4. The aortic valve is normal in structure and function. Aortic valve regurgitation is not visualized.  5. Agitated saline contrast bubble study was negative, with no evidence of any interatrial shunt. FINDINGS  Left Ventricle: Left ventricular ejection fraction, by estimation, is 55 to 60%. The left ventricle has normal function. The left ventricle has no regional wall motion abnormalities. The left ventricular internal cavity size was normal in size. There is  no left ventricular hypertrophy. Left ventricular diastolic parameters were normal. Right Ventricle: The right ventricular size is normal. No increase in right ventricular wall thickness. Right ventricular systolic function is normal. Left Atrium: Left atrial size was normal in size. Right Atrium: Right atrial size was normal in size. Pericardium: There is no evidence of pericardial effusion. Mitral Valve: The mitral valve is normal in structure and function. Trivial mitral valve regurgitation. MV peak gradient, 3.0 mmHg. The mean mitral valve gradient is 1.0 mmHg. Tricuspid Valve: The tricuspid valve is normal in structure. Tricuspid valve regurgitation is trivial. Aortic Valve: The aortic valve is normal in structure and function.  Aortic valve regurgitation is not visualized. Aortic valve mean gradient measures 2.0 mmHg. Aortic valve peak gradient measures 3.0 mmHg.  Aortic valve area, by VTI measures 2.87 cm. Pulmonic Valve: The pulmonic valve was normal in structure. Pulmonic valve regurgitation is not visualized. Aorta: The aortic root and ascending aorta are structurally normal, with no evidence of dilitation. IAS/Shunts: No atrial level shunt detected by color flow Doppler. Agitated saline contrast was given intravenously to evaluate for intracardiac shunting. Agitated saline contrast bubble study was negative, with no evidence of any interatrial shunt.  LEFT VENTRICLE PLAX 2D LVIDd:         4.19 cm  Diastology LVIDs:         2.25 cm  LV e' lateral:   10.60 cm/s LV PW:         0.84 cm  LV E/e' lateral: 7.1 LV IVS:        0.86 cm  LV e' medial:    13.60 cm/s LVOT diam:     2.00 cm  LV E/e' medial:  5.5 LV SV:         47 LV SV Index:   27 LVOT Area:     3.14 cm  LEFT ATRIUM             Index LA diam:        2.70 cm 1.54 cm/m LA Vol (A2C):   34.8 ml 19.79 ml/m LA Vol (A4C):   18.7 ml 10.63 ml/m LA Biplane Vol: 26.8 ml 15.24 ml/m  AORTIC VALVE                   PULMONIC VALVE AV Area (Vmax):    2.69 cm    PV Vmax:       0.76 m/s AV Area (Vmean):   2.56 cm    PV Vmean:      51.200 cm/s AV Area (VTI):     2.87 cm    PV VTI:        0.167 m AV Vmax:           86.20 cm/s  PV Peak grad:  2.3 mmHg AV Vmean:          60.200 cm/s PV Mean grad:  1.0 mmHg AV VTI:            0.163 m AV Peak Grad:      3.0 mmHg AV Mean Grad:      2.0 mmHg LVOT Vmax:         73.90 cm/s LVOT Vmean:        49.100 cm/s LVOT VTI:          0.149 m LVOT/AV VTI ratio: 0.91  AORTA Ao Root diam: 3.00 cm MITRAL VALVE MV Area (PHT): 4.68 cm    SHUNTS MV Peak grad:  3.0 mmHg    Systemic VTI:  0.15 m MV Mean grad:  1.0 mmHg    Systemic Diam: 2.00 cm MV Vmax:       0.87 m/s MV Vmean:      56.4 cm/s MV Decel Time: 162 msec MV E velocity: 75.20 cm/s MV A velocity: 68.90 cm/s MV E/A ratio:  1.09 Arnoldo HookerBruce Kowalski MD Electronically signed by Arnoldo HookerBruce Kowalski MD Signature Date/Time: 08/26/2019/12:58:44 PM    Final    CT  HEAD CODE STROKE WO CONTRAST  Result Date: 08/25/2019 CLINICAL DATA:  Code stroke.  TIA.  Headache and chest pain EXAM: CT HEAD WITHOUT CONTRAST TECHNIQUE: Contiguous axial images were obtained  from the base of the skull through the vertex without intravenous contrast. COMPARISON:  None. FINDINGS: Brain: No evidence of acute infarction, hemorrhage, hydrocephalus, extra-axial collection or mass lesion/mass effect. Vascular: Negative for hyperdense vessel Skull: Negative Sinuses/Orbits: Negative Other: None ASPECTS ([place] Stroke Program Early CT Score) - Ganglionic level infarction (caudate, lentiform nuclei, internal capsule, insula, M1-M3 cortex): 7 - Supraganglionic infarction (M4-M6 cortex): 3 Total score (0-10 with 10 being normal): 10 IMPRESSION: 1. Negative CT head 2. ASPECTS is 10 3. These results were called by telephone at the time of interpretation on 08/25/2019 at 4:15 pm to provider [facility] , who verbally acknowledged these results. Electronically Signed   By: Marlan Palau M.D.   On: 08/25/2019 16:15     I have reviewed the above imaging; MRI brain CTA head and neck   ASSESSMENT AND PLAN  49 y.o. female with past medical history significant for hypertension, hyperlipidemia, diabetes mellitus, headaches presents to the emergency department yesterday around 4 PM after her husband brought her for difficulty getting words out.   While she does have significant risk factors, description of event favors migraine.  Husband denies any difficulty with language and appeared to be slowness in speech.  No other stroke signs symptoms.  MRI brain is negative for acute stroke. Echocardiogram showed no PFO. She does need her stroke risk factors controlled-A1c is 9.1.  LDL is less than 100.  Based on description of a headache, she does have migraine headache that occur frequently to warrant a prophylactic agent.  Patient has poor sleep and therefore will recommend nortriptyline.     Complex  migraine, less likely TIA  Recommendations Start nortriptyline 25 mg nightly Outpatient neurology follow-up Continue management of diabetes, hypertension and hyperlipidemia for primary prevention of stroke    Jody Gonzalez [facility] Pager Number [phone]

## 2019-08-26 NOTE — Progress Notes (Signed)
Pt off unit for MRI

## 2019-08-26 NOTE — Progress Notes (Signed)
PT Cancellation Note  Patient Details Name: Jody Gonzalez MRN: [id number] DOB: 1971/01/04   Cancelled Treatment:    Reason Eval/Treat Not Completed: PT screened, no needs identified, will sign off.  PT consult received.  Chart reviewed.  Pt resting in bed upon PT arrival; pt's husband present.  Per discussion with pt and pt's husband, pt's speech back to normal and no further chest pain noted.  Intact B LE strength, light touch, heel to shin coordination, proprioception, and tone.  Pt reports 2/10 headache.  No loss of balance with ambulation or functional mobility (no AD).  No loss of balance with ambulation with head turns R/L/up/down, increasing/decreasing speed, and turning and stopping.  Good safety awareness noted.  No acute PT needs identified; will sign off.   Hendricks Limes, PT 08/26/19, 2:52 PM

## 2019-08-26 NOTE — Progress Notes (Signed)
OT Cancellation Note  Patient Details Name: Jody Gonzalez MRN: [id number] DOB: 07-18-1970   Cancelled Treatment:    Reason Eval/Treat Not Completed: Patient at procedure or test/ unavailable  OT consult received and chart reviewed. Pt leaving floor for MRI at this time. Will f/u for OT evaluation as pt becomes available/as able. Thank you.  Rejeana Brock, MS, OTR/L ascom [phone] 08/26/19, 10:29 AM

## 2019-08-26 NOTE — Progress Notes (Signed)
Discharge instructions provided to pt.  All questions addressed.  Understanding verified through teach back.  Awaiting transportation home via POV with spouse.

## 2019-08-26 NOTE — Discharge Summary (Signed)
[facility] NAME: Jody Gonzalez    MR#:  [id number]  DATE OF BIRTH:  04-01-1971  DATE OF ADMISSION:  08/25/2019 ADMITTING PHYSICIAN: Assunta Found, MD  DATE OF DISCHARGE: 08/26/2019  PRIMARY CARE PHYSICIAN: Jene Every, MD    ADMISSION DIAGNOSIS:  TIA (transient ischemic attack) [G45.9] Word finding difficulty [R47.89] Complaint of headache [R51.9]  DISCHARGE DIAGNOSIS:  Complex migraine  SECONDARY DIAGNOSIS:   Past Medical History:  Diagnosis Date  . Diabetes mellitus without complication ([place])   . Hyperlipidemia   . Hypertension   . Type 2 diabetes mellitus ([place])     HOSPITAL COURSE:   1.  Complex migraine with difficulty with word finding and talking and headache.  Stroke work-up was negative including MRI of the brain, CT angio of the head and neck and echocardiogram with bubble study.  Seen by neurology and recommended nortriptyline 25 mg nightly for migraine prevention.  I did give dose of magnesium and Toradol here in the hospital.  Follow-up with neurology as outpatient. 2.  Type 2 diabetes mellitus.  Okay to go back on usual regimen at home.  Hemoglobin A1c elevated at 9.2.  Last 2 fingersticks were 203 and 150. 3.  Essential hypertension can give half dose of Micardis at this time 4.  Hyperlipidemia unspecified on Lipitor.  LDL at goal at 83.  DISCHARGE CONDITIONS:   Satisfactory  CONSULTS OBTAINED:  Treatment Team:  Aroor, Lanice Schwab, MD  DRUG ALLERGIES:   Allergies  Allergen Reactions  . Azithromycin   . Imitrex [Sumatriptan]   . Lisinopril Cough    DISCHARGE MEDICATIONS:   Allergies as of 08/26/2019      Reactions   Azithromycin    Imitrex [sumatriptan]    Lisinopril Cough      Medication List    TAKE these medications   aspirin EC 81 MG tablet Take 1 tablet (81 mg total) by mouth daily.   atorvastatin 80 MG tablet Commonly known as: LIPITOR Take 80 mg by mouth every evening.    Bydureon BCise 2 MG/0.85ML Auij Generic drug: Exenatide ER Inject 2 mg into the skin every Saturday.   Hair Skin and Nails Formula Tabs Take 1 tablet by mouth as directed.   metFORMIN 1000 MG tablet Commonly known as: Glucophage Take 1 tablet (1,000 mg total) by mouth 2 (two) times daily with a meal.   nitroGLYCERIN 0.4 MG SL tablet Commonly known as: NITROSTAT Place 1 tablet (0.4 mg total) under the tongue every 5 (five) minutes as needed for chest pain. Maximum of 3 doses.   nortriptyline 25 MG capsule Commonly known as: Pamelor Take 1 capsule (25 mg total) by mouth at bedtime.   PREMPRO PO Take 1 tablet by mouth daily.   telmisartan 40 MG tablet Commonly known as: Micardis Take 0.5 tablets (20 mg total) by mouth daily. What changed: how much to take        DISCHARGE INSTRUCTIONS:   Follow-up PMD 5 days Refer to neurology as outpatient  If you experience worsening of your admission symptoms, develop shortness of breath, life threatening emergency, suicidal or homicidal thoughts you must seek medical attention immediately by calling 911 or calling your MD immediately  if symptoms less severe.  You Must read complete instructions/literature along with all the possible adverse reactions/side effects for all the Medicines you take and that have been prescribed to you. Take any new Medicines after you have completely understood and accept all  the possible adverse reactions/side effects.   Please note  You were cared for by a hospitalist during your hospital stay. If you have any questions about your discharge medications or the care you received while you were in the hospital after you are discharged, you can call the unit and asked to speak with the hospitalist on call if the hospitalist that took care of you is not available. Once you are discharged, your primary care physician will handle any further medical issues. Please note that NO REFILLS for any discharge medications  will be authorized once you are discharged, as it is imperative that you return to your primary care physician (or establish a relationship with a primary care physician if you do not have one) for your aftercare needs so that they can reassess your need for medications and monitor your lab values.    Today   CHIEF COMPLAINT:   Chief Complaint  Patient presents with  . Headache  . Chest Pain  . Aphasia    HISTORY OF PRESENT ILLNESS:  Jody Gonzalez  is a 49 y.o. female came in with headache and difficulty with word finding and speaking   VITAL SIGNS:  Blood pressure 134/83, pulse 70, temperature 98.6 F (37 C), temperature source Oral, resp. rate 19, height 5\' 3"  (1.6 m), weight 72.6 kg, SpO2 99 %.  I/O:    Intake/Output Summary (Last 24 hours) at 08/26/2019 1334 Last data filed at 08/26/2019 1300 Gross per 24 hour  Intake 1392.33 ml  Output 1825 ml  Net -432.67 ml    PHYSICAL EXAMINATION:  GENERAL:  49 y.o.-year-old patient lying in the bed with no acute distress.  EYES: Pupils equal, round, reactive to light and accommodation. No scleral icterus. Extraocular muscles intact.  HEENT: Head atraumatic, normocephalic. Oropharynx and nasopharynx clear.  NECK: No bruits LUNGS: Normal breath sounds bilaterally, no wheezing, rales,rhonchi or crepitation. No use of accessory muscles of respiration.  CARDIOVASCULAR: S1, S2 normal. No murmurs, rubs, or gallops.  ABDOMEN: Soft, non-tender, non-distended. Bowel sounds present. No organomegaly or mass.  EXTREMITIES: No pedal edema, cyanosis, or clubbing.  NEUROLOGIC: Cranial nerves II through XII are intact. Muscle strength 5/5 in all extremities. Sensation intact. Gait not checked.  PSYCHIATRIC: The patient is alert and oriented x 3.  SKIN: No obvious rash, lesion, or ulcer.   DATA REVIEW:   CBC Recent Labs  Lab 08/26/19 0554  WBC 5.9  HGB 13.4  HCT 38.8  PLT 199    Chemistries  Recent Labs  Lab 08/26/19 0554  NA 137   K 3.8  CL 103  CO2 26  GLUCOSE 226*  BUN 8  CREATININE 0.58  CALCIUM 8.3*  AST 19  ALT 35  ALKPHOS 72  BILITOT 1.0    Microbiology Results  Results for orders placed or performed during the hospital encounter of 08/25/19  SARS CORONAVIRUS 2 (TAT 6-24 HRS) Nasopharyngeal Nasopharyngeal Swab     Status: None   Collection Time: 08/25/19  7:23 PM   Specimen: Nasopharyngeal Swab  Result Value Ref Range Status   SARS Coronavirus 2 NEGATIVE NEGATIVE Final    Comment: (NOTE) SARS-CoV-2 target nucleic acids are NOT DETECTED. The SARS-CoV-2 RNA is generally detectable in upper and lower respiratory specimens during the acute phase of infection. Negative results do not preclude SARS-CoV-2 infection, do not rule out co-infections with other pathogens, and should not be used as the sole basis for treatment or other patient management decisions. Negative results must be combined with  clinical observations, patient history, and epidemiological information. The expected result is Negative. Fact Sheet for Patients: HairSlick.no Fact Sheet for Healthcare Providers: quierodirigir.com This test is not yet approved or cleared by the [place] FDA and  has been authorized for detection and/or diagnosis of SARS-CoV-2 by FDA under an Emergency Use Authorization (EUA). This EUA will remain  in effect (meaning this test can be used) for the duration of the COVID-19 declaration under Section 56 4(b)(1) of the Act, 21 U.S.C. section 360bbb-3(b)(1), unless the authorization is terminated or revoked sooner. Performed at [facility], [address] [place], [place] [address]     RADIOLOGY:  CT ANGIO HEAD W OR WO CONTRAST  Addendum Date: 08/25/2019   ADDENDUM REPORT: 08/25/2019 22:05 ADDENDUM: CTA head/neck and CT perfusion head findings were called by telephone at the time of interpretation on 08/25/2019 at 6:11 p.m. pm to provider  Dr. Colon Branch, who verbally acknowledged these results. Electronically Signed   By: Jackey Loge DO   On: 08/25/2019 22:05   Result Date: 08/25/2019 CLINICAL DATA:  Neuro deficit, acute, stroke suspected. Additional history provided: Acute onset word finding difficulty. EXAM: CT ANGIOGRAPHY HEAD AND NECK CT PERFUSION BRAIN TECHNIQUE: Multidetector CT imaging of the head and neck was performed using the standard protocol during bolus administration of intravenous contrast. Multiplanar CT image reconstructions and MIPs were obtained to evaluate the vascular anatomy. Carotid stenosis measurements (when applicable) are obtained utilizing NASCET criteria, using the distal internal carotid diameter as the denominator. Multiphase CT imaging of the brain was performed following IV bolus contrast injection. Subsequent parametric perfusion maps were calculated using RAPID software. CONTRAST:  OMNIPAQUE IOHEXOL 350 MG/ML SOLN COMPARISON:  Noncontrast head CT performed earlier the same day 08/25/2019. FINDINGS: CT HEAD FINDINGS Brain: No evidence of acute intracranial hemorrhage. No demarcated cortical infarction. No evidence of intracranial mass. No midline shift or extra-axial fluid collection. Minimal scattered ill-defined hypoattenuation within the cerebral white matter is nonspecific, but consistent with chronic small vessel ischemic disease. Cerebral volume is normal for age. Vascular: Reported separately. Skull: Normal. Negative for fracture or focal lesion. Sinuses/Orbits: Visualized orbits demonstrate no acute abnormality. No significant paranasal sinus disease or mastoid effusion at the imaged levels. CTA NECK FINDINGS Aortic arch: Standard aortic branching. The visualized aortic arch is unremarkable. No significant innominate or proximal subclavian artery stenosis. Right carotid system: CCA and ICA patent within the neck without stenosis. Left carotid system: CCA and ICA patent within the neck without stenosis.  Vertebral arteries: Codominant. The vertebral arteries are patent within the neck without stenosis. Skeleton: No acute bony abnormality. Mild cervical spondylosis. No high-grade bony spinal canal stenosis Other neck: No neck mass or cervical lymphadenopathy. Upper chest: No consolidation within the imaged lung apices. Review of the MIP images confirms the above findings CTA HEAD FINDINGS Anterior circulation: The intracranial internal carotid arteries are patent without stenosis. The M1 middle cerebral arteries are patent without significant stenosis. No M2 proximal branch occlusion or high-grade proximal stenosis is identified. The anterior cerebral arteries are patent without high-grade proximal stenosis. No intracranial aneurysm is identified. Posterior circulation: The intracranial vertebral arteries are patent without significant stenosis, as is the basilar artery. The bilateral posterior cerebral arteries are patent without significant proximal stenosis. Posterior communicating arteries are poorly delineated and may be hypoplastic or absent bilaterally. Venous sinuses: Within limitations of contrast timing, no convincing thrombus. Anatomic variants: As described Review of the MIP images confirms the above findings CT Brain Perfusion Findings:  CBF (<30%) Volume: 0 ML Perfusion (Tmax>6.0s) volume: 89mL Mismatch Volume: 59mL Infarction Location:None identified IMPRESSION: CT head: 1. No evidence of acute intracranial abnormality. 2. Minimal scattered ill-defined hypoattenuation within the cerebral white matter is nonspecific, but consistent with chronic small vessel ischemic disease. CTA neck: The bilateral common carotid, internal carotid and vertebral arteries are patent within the neck without stenosis. CTA head: No intracranial large vessel occlusion or proximal high-grade arterial stenosis. CT perfusion head: The perfusion software identifies no core infarct. The perfusion software identifies no region of  critically hypoperfused parenchyma utilizing the region Tmax>6 seconds threshold. Electronically Signed: By: Jackey Loge DO On: 08/25/2019 18:04   CT ANGIO NECK W OR WO CONTRAST  Addendum Date: 08/25/2019   ADDENDUM REPORT: 08/25/2019 22:05 ADDENDUM: CTA head/neck and CT perfusion head findings were called by telephone at the time of interpretation on 08/25/2019 at 6:11 p.m. pm to provider Dr. Colon Branch, who verbally acknowledged these results. Electronically Signed   By: Jackey Loge DO   On: 08/25/2019 22:05   Result Date: 08/25/2019 CLINICAL DATA:  Neuro deficit, acute, stroke suspected. Additional history provided: Acute onset word finding difficulty. EXAM: CT ANGIOGRAPHY HEAD AND NECK CT PERFUSION BRAIN TECHNIQUE: Multidetector CT imaging of the head and neck was performed using the standard protocol during bolus administration of intravenous contrast. Multiplanar CT image reconstructions and MIPs were obtained to evaluate the vascular anatomy. Carotid stenosis measurements (when applicable) are obtained utilizing NASCET criteria, using the distal internal carotid diameter as the denominator. Multiphase CT imaging of the brain was performed following IV bolus contrast injection. Subsequent parametric perfusion maps were calculated using RAPID software. CONTRAST:  OMNIPAQUE IOHEXOL 350 MG/ML SOLN COMPARISON:  Noncontrast head CT performed earlier the same day 08/25/2019. FINDINGS: CT HEAD FINDINGS Brain: No evidence of acute intracranial hemorrhage. No demarcated cortical infarction. No evidence of intracranial mass. No midline shift or extra-axial fluid collection. Minimal scattered ill-defined hypoattenuation within the cerebral white matter is nonspecific, but consistent with chronic small vessel ischemic disease. Cerebral volume is normal for age. Vascular: Reported separately. Skull: Normal. Negative for fracture or focal lesion. Sinuses/Orbits: Visualized orbits demonstrate no acute abnormality. No  significant paranasal sinus disease or mastoid effusion at the imaged levels. CTA NECK FINDINGS Aortic arch: Standard aortic branching. The visualized aortic arch is unremarkable. No significant innominate or proximal subclavian artery stenosis. Right carotid system: CCA and ICA patent within the neck without stenosis. Left carotid system: CCA and ICA patent within the neck without stenosis. Vertebral arteries: Codominant. The vertebral arteries are patent within the neck without stenosis. Skeleton: No acute bony abnormality. Mild cervical spondylosis. No high-grade bony spinal canal stenosis Other neck: No neck mass or cervical lymphadenopathy. Upper chest: No consolidation within the imaged lung apices. Review of the MIP images confirms the above findings CTA HEAD FINDINGS Anterior circulation: The intracranial internal carotid arteries are patent without stenosis. The M1 middle cerebral arteries are patent without significant stenosis. No M2 proximal branch occlusion or high-grade proximal stenosis is identified. The anterior cerebral arteries are patent without high-grade proximal stenosis. No intracranial aneurysm is identified. Posterior circulation: The intracranial vertebral arteries are patent without significant stenosis, as is the basilar artery. The bilateral posterior cerebral arteries are patent without significant proximal stenosis. Posterior communicating arteries are poorly delineated and may be hypoplastic or absent bilaterally. Venous sinuses: Within limitations of contrast timing, no convincing thrombus. Anatomic variants: As described Review of the MIP images confirms the above findings CT Brain Perfusion Findings:  CBF (<30%) Volume: 0 ML Perfusion (Tmax>6.0s) volume: 4mL Mismatch Volume: 8mL Infarction Location:None identified IMPRESSION: CT head: 1. No evidence of acute intracranial abnormality. 2. Minimal scattered ill-defined hypoattenuation within the cerebral white matter is nonspecific,  but consistent with chronic small vessel ischemic disease. CTA neck: The bilateral common carotid, internal carotid and vertebral arteries are patent within the neck without stenosis. CTA head: No intracranial large vessel occlusion or proximal high-grade arterial stenosis. CT perfusion head: The perfusion software identifies no core infarct. The perfusion software identifies no region of critically hypoperfused parenchyma utilizing the region Tmax>6 seconds threshold. Electronically Signed: By: Jackey Loge DO On: 08/25/2019 18:04   MR BRAIN WO CONTRAST  Result Date: 08/26/2019 CLINICAL DATA:  Transient ischemic attack.  Migraine. EXAM: MRI HEAD WITHOUT CONTRAST TECHNIQUE: Multiplanar, multiecho pulse sequences of the brain and surrounding structures were obtained without intravenous contrast. COMPARISON:  None. FINDINGS: Brain: No acute infarction, hemorrhage, hydrocephalus, extra-axial collection or mass lesion. There are a few remote cerebral white matter insults, commonly seen to this degree in patients of this age. These could be from old microvascular insults, trauma, inflammation, or complicated migraine. Brain volume is normal. Vascular: Normal flow voids Skull and upper cervical spine: Normal marrow signal Sinuses/Orbits: Negative IMPRESSION: Negative for acute infarct and no specific explanation for headache. Electronically Signed   By: Marnee Spring M.D.   On: 08/26/2019 11:17   CT CEREBRAL PERFUSION W CONTRAST  Addendum Date: 08/25/2019   ADDENDUM REPORT: 08/25/2019 22:05 ADDENDUM: CTA head/neck and CT perfusion head findings were called by telephone at the time of interpretation on 08/25/2019 at 6:11 p.m. pm to provider Dr. Colon Branch, who verbally acknowledged these results. Electronically Signed   By: Jackey Loge DO   On: 08/25/2019 22:05   Result Date: 08/25/2019 CLINICAL DATA:  Neuro deficit, acute, stroke suspected. Additional history provided: Acute onset word finding difficulty. EXAM: CT  ANGIOGRAPHY HEAD AND NECK CT PERFUSION BRAIN TECHNIQUE: Multidetector CT imaging of the head and neck was performed using the standard protocol during bolus administration of intravenous contrast. Multiplanar CT image reconstructions and MIPs were obtained to evaluate the vascular anatomy. Carotid stenosis measurements (when applicable) are obtained utilizing NASCET criteria, using the distal internal carotid diameter as the denominator. Multiphase CT imaging of the brain was performed following IV bolus contrast injection. Subsequent parametric perfusion maps were calculated using RAPID software. CONTRAST:  OMNIPAQUE IOHEXOL 350 MG/ML SOLN COMPARISON:  Noncontrast head CT performed earlier the same day 08/25/2019. FINDINGS: CT HEAD FINDINGS Brain: No evidence of acute intracranial hemorrhage. No demarcated cortical infarction. No evidence of intracranial mass. No midline shift or extra-axial fluid collection. Minimal scattered ill-defined hypoattenuation within the cerebral white matter is nonspecific, but consistent with chronic small vessel ischemic disease. Cerebral volume is normal for age. Vascular: Reported separately. Skull: Normal. Negative for fracture or focal lesion. Sinuses/Orbits: Visualized orbits demonstrate no acute abnormality. No significant paranasal sinus disease or mastoid effusion at the imaged levels. CTA NECK FINDINGS Aortic arch: Standard aortic branching. The visualized aortic arch is unremarkable. No significant innominate or proximal subclavian artery stenosis. Right carotid system: CCA and ICA patent within the neck without stenosis. Left carotid system: CCA and ICA patent within the neck without stenosis. Vertebral arteries: Codominant. The vertebral arteries are patent within the neck without stenosis. Skeleton: No acute bony abnormality. Mild cervical spondylosis. No high-grade bony spinal canal stenosis Other neck: No neck mass or cervical lymphadenopathy. Upper chest: No  consolidation within the imaged lung  apices. Review of the MIP images confirms the above findings CTA HEAD FINDINGS Anterior circulation: The intracranial internal carotid arteries are patent without stenosis. The M1 middle cerebral arteries are patent without significant stenosis. No M2 proximal branch occlusion or high-grade proximal stenosis is identified. The anterior cerebral arteries are patent without high-grade proximal stenosis. No intracranial aneurysm is identified. Posterior circulation: The intracranial vertebral arteries are patent without significant stenosis, as is the basilar artery. The bilateral posterior cerebral arteries are patent without significant proximal stenosis. Posterior communicating arteries are poorly delineated and may be hypoplastic or absent bilaterally. Venous sinuses: Within limitations of contrast timing, no convincing thrombus. Anatomic variants: As described Review of the MIP images confirms the above findings CT Brain Perfusion Findings: CBF (<30%) Volume: 0 ML Perfusion (Tmax>6.0s) volume: 0mL Mismatch Volume: 0mL Infarction Location:None identified IMPRESSION: CT head: 1. No evidence of acute intracranial abnormality. 2. Minimal scattered ill-defined hypoattenuation within the cerebral white matter is nonspecific, but consistent with chronic small vessel ischemic disease. CTA neck: The bilateral common carotid, internal carotid and vertebral arteries are patent within the neck without stenosis. CTA head: No intracranial large vessel occlusion or proximal high-grade arterial stenosis. CT perfusion head: The perfusion software identifies no core infarct. The perfusion software identifies no region of critically hypoperfused parenchyma utilizing the region Tmax>6 seconds threshold. Electronically Signed: By: Jackey LogeKyle  Golden DO On: 08/25/2019 18:04   ECHOCARDIOGRAM COMPLETE  Result Date: 08/26/2019    ECHOCARDIOGRAM REPORT   Patient Name:   Jody Gonzalez Date of Exam:  08/26/2019 Medical Rec #:  [id number]    Height:       63.0 in Accession #:    [id number]   Weight:       160.0 lb Date of Birth:  03/18/1971     BSA:          1.759 m Patient Age:    48 years     BP:           126/79 mmHg Patient Gender: F            HR:           79 bpm. Exam Location:  [facility] Procedure: 2D Echo, Color Doppler, Cardiac Doppler and Saline Contrast Bubble            Study Indications:     G45.9 TIA  History:         Patient has no prior history of Echocardiogram examinations.                  Risk Factors:Hypertension, Diabetes, Dyslipidemia and Family                  History of Coronary Artery Disease.  Sonographer:     Humphrey RollsJoan Heiss RDCS (AE) Referring Phys:  [id number] Victorino SparrowMIRCEA G CRISTESCU Diagnosing Phys: Arnoldo HookerBruce Kowalski MD  Sonographer Comments: Suboptimal apical window. IMPRESSIONS  1. Left ventricular ejection fraction, by estimation, is 55 to 60%. The left ventricle has normal function. The left ventricle has no regional wall motion abnormalities. Left ventricular diastolic parameters were normal.  2. Right ventricular systolic function is normal. The right ventricular size is normal.  3. The mitral valve is normal in structure and function. Trivial mitral valve regurgitation.  4. The aortic valve is normal in structure and function. Aortic valve regurgitation is not visualized.  5. Agitated saline contrast bubble study was negative, with no evidence of any interatrial shunt. FINDINGS  Left Ventricle: Left ventricular ejection fraction, by  estimation, is 55 to 60%. The left ventricle has normal function. The left ventricle has no regional wall motion abnormalities. The left ventricular internal cavity size was normal in size. There is  no left ventricular hypertrophy. Left ventricular diastolic parameters were normal. Right Ventricle: The right ventricular size is normal. No increase in right ventricular wall thickness. Right ventricular systolic function is normal. Left Atrium: Left atrial size was  normal in size. Right Atrium: Right atrial size was normal in size. Pericardium: There is no evidence of pericardial effusion. Mitral Valve: The mitral valve is normal in structure and function. Trivial mitral valve regurgitation. MV peak gradient, 3.0 mmHg. The mean mitral valve gradient is 1.0 mmHg. Tricuspid Valve: The tricuspid valve is normal in structure. Tricuspid valve regurgitation is trivial. Aortic Valve: The aortic valve is normal in structure and function. Aortic valve regurgitation is not visualized. Aortic valve mean gradient measures 2.0 mmHg. Aortic valve peak gradient measures 3.0 mmHg. Aortic valve area, by VTI measures 2.87 cm. Pulmonic Valve: The pulmonic valve was normal in structure. Pulmonic valve regurgitation is not visualized. Aorta: The aortic root and ascending aorta are structurally normal, with no evidence of dilitation. IAS/Shunts: No atrial level shunt detected by color flow Doppler. Agitated saline contrast was given intravenously to evaluate for intracardiac shunting. Agitated saline contrast bubble study was negative, with no evidence of any interatrial shunt.  LEFT VENTRICLE PLAX 2D LVIDd:         4.19 cm  Diastology LVIDs:         2.25 cm  LV e' lateral:   10.60 cm/s LV PW:         0.84 cm  LV E/e' lateral: 7.1 LV IVS:        0.86 cm  LV e' medial:    13.60 cm/s LVOT diam:     2.00 cm  LV E/e' medial:  5.5 LV SV:         47 LV SV Index:   27 LVOT Area:     3.14 cm  LEFT ATRIUM             Index LA diam:        2.70 cm 1.54 cm/m LA Vol (A2C):   34.8 ml 19.79 ml/m LA Vol (A4C):   18.7 ml 10.63 ml/m LA Biplane Vol: 26.8 ml 15.24 ml/m  AORTIC VALVE                   PULMONIC VALVE AV Area (Vmax):    2.69 cm    PV Vmax:       0.76 m/s AV Area (Vmean):   2.56 cm    PV Vmean:      51.200 cm/s AV Area (VTI):     2.87 cm    PV VTI:        0.167 m AV Vmax:           86.20 cm/s  PV Peak grad:  2.3 mmHg AV Vmean:          60.200 cm/s PV Mean grad:  1.0 mmHg AV VTI:            0.163  m AV Peak Grad:      3.0 mmHg AV Mean Grad:      2.0 mmHg LVOT Vmax:         73.90 cm/s LVOT Vmean:        49.100 cm/s LVOT VTI:  0.149 m LVOT/AV VTI ratio: 0.91  AORTA Ao Root diam: 3.00 cm MITRAL VALVE MV Area (PHT): 4.68 cm    SHUNTS MV Peak grad:  3.0 mmHg    Systemic VTI:  0.15 m MV Mean grad:  1.0 mmHg    Systemic Diam: 2.00 cm MV Vmax:       0.87 m/s MV Vmean:      56.4 cm/s MV Decel Time: 162 msec MV E velocity: 75.20 cm/s MV A velocity: 68.90 cm/s MV E/A ratio:  1.09 Arnoldo HookerBruce Kowalski MD Electronically signed by Arnoldo HookerBruce Kowalski MD Signature Date/Time: 08/26/2019/12:58:44 PM    Final    CT HEAD CODE STROKE WO CONTRAST  Result Date: 08/25/2019 CLINICAL DATA:  Code stroke.  TIA.  Headache and chest pain EXAM: CT HEAD WITHOUT CONTRAST TECHNIQUE: Contiguous axial images were obtained from the base of the skull through the vertex without intravenous contrast. COMPARISON:  None. FINDINGS: Brain: No evidence of acute infarction, hemorrhage, hydrocephalus, extra-axial collection or mass lesion/mass effect. Vascular: Negative for hyperdense vessel Skull: Negative Sinuses/Orbits: Negative Other: None ASPECTS (Alberta Stroke Program Early CT Score) - Ganglionic level infarction (caudate, lentiform nuclei, internal capsule, insula, M1-M3 cortex): 7 - Supraganglionic infarction (M4-M6 cortex): 3 Total score (0-10 with 10 being normal): 10 IMPRESSION: 1. Negative CT head 2. ASPECTS is 10 3. These results were called by telephone at the time of interpretation on 08/25/2019 at 4:15 pm to provider [facility] , who verbally acknowledged these results. Electronically Signed   By: Marlan Palauharles  Clark M.D.   On: 08/25/2019 16:15     Management plans discussed with the patient, family and they are in agreement.  CODE STATUS:     Code Status Orders  (From admission, onward)         Start     Ordered   08/26/19 0538  Full code  Continuous     08/26/19 0537        Code Status History    This patient has a  current code status but no historical code status.   Advance Care Planning Activity      TOTAL TIME TAKING CARE OF THIS PATIENT: 35 minutes.    Alford Highlandichard Jaydah Stahle M.D on 08/26/2019 at 1:34 PM  Between 7am to 6pm - Pager - [phone]  After 6pm go to [email or web address] - password [email or web address]  [facility]  CC: Primary care physician; Barron AlvineGibson, David, MD

## 2019-11-06 ENCOUNTER — Other Ambulatory Visit

## 2019-11-24 ENCOUNTER — Telehealth: Payer: Self-pay | Admitting: *Deleted

## 2019-11-24 NOTE — Telephone Encounter (Signed)
Berle Mull  Jaycelynn Knickerbocker, Sherlynn Stalls, RN  Jishnu Jenniges   I am sending 10 day letter to this patient , we have been trying for 5 months to get this patient schedule. If no response by 12/07/19 we will delete order.   Thank you  Beacher May

## 2019-12-08 ENCOUNTER — Telehealth: Payer: Self-pay | Admitting: *Deleted

## 2019-12-08 NOTE — Telephone Encounter (Signed)
cancelling order Received: Today Message Contents  Jody Gonzalez  Jody Gonzalez, Jody Stalls, RN  Jody Gonzalez   We have attempted to reach this patient numerous time to schedule Cardiac ct, we will delete this order for now until the patient calls back to get appt scheduled.   Jody Gonzalez 07/14/2019 2:21 PM 07-14-19 precert pending.dp  Jody Gonzalez 07/14/2019 3:05 PM notes in referral  Jody Gonzalez 07/17/2019 10:36 AM NO PRECERT REQUIRED  Jody Gonzalez 07/23/2019 10:55 AM 07/23/19 1055a lmom to call back to schedule cardiac ct NJM  Jody Gonzalez 07/23/2019 12:23 PM 07/23/19 1223p lm returning pts call will try again after lunch njm  Jody Gonzalez 07/23/2019 1:59 PM 07/23/19 158p lm trying to reach patient njm  Jody Gonzalez 08/14/2019 9:10 AM 08/14/19 909a lmom to call back to reschedule cardiac ct Njm  Jody Gonzalez 08/14/2019 9:23 AM [9:19 AM] Jody Gonzalez hey yes ?[9:20 AM] Jody Gonzalez her husband said they were cancelling because she has [facility] and they need an auth from pcp ?[9:20 AM] Jody Gonzalez they had it incorrect in epic  Jody Gonzalez 08/25/2019 3:48 PM sent note through Teams to precert to see if they are getting new precert for patient ?  Jody Gonzalez 08/26/2019 9:18 AM [9:09 AM] Jody Gonzalez on Marlon Pel her husband said he was going to get auth and call back to schedule  Jody Gonzalez 09/30/2019 3:41 PM 09/30/19 340p spoke with pt she is waiting on her care coordinator to call back and confirm if its [employer] [facility] or [address] [place] and she will call back and let me know NJM  Jody Gonzalez 10/20/2019 9:59 AM 10/20/19 958a pt cancelled she is still working on Systems developer and other things -wcb to schedule when she gets things worked out  Jody Gonzalez 11/17/2019 9:06 AM 11/17/19 lmom to call back and update on ct NJM  Jody Gonzalez 11/24/2019 8:14 AM 11/24/19 814a lmom to call back - sent 10 day letter njm  Jody Gonzalez 11/24/2019 8:17 AM I  am sending 10 day letter to this patient , we have been trying for 5 months to get this patient schedule. If no response by 12/07/19 we will delete order.   Thanks  Jody Gonzalez

## 2019-12-12 ENCOUNTER — Encounter: Payer: Self-pay | Admitting: Internal Medicine

## 2020-01-26 ENCOUNTER — Encounter

## 2020-02-09 ENCOUNTER — Encounter: Admitting: Internal Medicine

## 2020-03-11 ENCOUNTER — Encounter

## 2020-03-25 ENCOUNTER — Encounter: Admitting: Internal Medicine

## 2020-05-24 ENCOUNTER — Other Ambulatory Visit: Payer: Self-pay | Admitting: *Deleted

## 2020-05-24 MED ORDER — NITROGLYCERIN 0.4 MG SL SUBL: 0.4000 mg | SUBLINGUAL_TABLET | SUBLINGUAL | 0 refills | Status: AC | PRN

## 2021-06-07 ENCOUNTER — Other Ambulatory Visit: Payer: Self-pay

## 2021-06-07 ENCOUNTER — Emergency Department

## 2021-06-07 ENCOUNTER — Emergency Department
Admission: EM | Admit: 2021-06-07 | Discharge: 2021-06-07 | Disposition: A | Discharge status: Home / Self Care | Attending: Emergency Medicine | Admitting: Emergency Medicine

## 2021-06-07 DIAGNOSIS — R0781 Pleurodynia: Secondary | ICD-10-CM | POA: Diagnosis present

## 2021-06-07 DIAGNOSIS — E119 Type 2 diabetes mellitus without complications: Secondary | ICD-10-CM | POA: Diagnosis not present

## 2021-06-07 DIAGNOSIS — R079 Chest pain, unspecified: Secondary | ICD-10-CM

## 2021-06-07 DIAGNOSIS — Z7982 Long term (current) use of aspirin: Secondary | ICD-10-CM | POA: Insufficient documentation

## 2021-06-07 DIAGNOSIS — I10 Essential (primary) hypertension: Secondary | ICD-10-CM | POA: Diagnosis not present

## 2021-06-07 DIAGNOSIS — Z7984 Long term (current) use of oral hypoglycemic drugs: Secondary | ICD-10-CM | POA: Diagnosis not present

## 2021-06-07 DIAGNOSIS — Z79899 Other long term (current) drug therapy: Secondary | ICD-10-CM | POA: Insufficient documentation

## 2021-06-07 LAB — TROPONIN I (HIGH SENSITIVITY)
Troponin I (High Sensitivity): 3 ng/L (ref <18)
Troponin I (High Sensitivity): 3 ng/L (ref <18)

## 2021-06-07 LAB — BASIC METABOLIC PANEL
Anion gap: 6 (ref 5–15)
BUN: 13 mg/dL (ref 6–20)
CO2: 26 mmol/L (ref 22–32)
Calcium: 9 mg/dL (ref 8.9–10.3)
Chloride: 104 mmol/L (ref 98–111)
Creatinine, Ser: 0.46 mg/dL (ref 0.44–1.00)
GFR, Estimated: >60 mL/min (ref >60)
Glucose, Bld: 196 mg/dL — ABNORMAL HIGH (ref 70–99)
Potassium: 3.3 mmol/L — ABNORMAL LOW (ref 3.5–5.1)
Sodium: 136 mmol/L (ref 135–145)

## 2021-06-07 LAB — CBC
HCT: 39.1 % (ref 36.0–46.0)
Hemoglobin: 13.7 g/dL (ref 12.0–15.0)
MCH: 32.2 pg (ref 26.0–34.0)
MCHC: 35 g/dL (ref 30.0–36.0)
MCV: 92 fL (ref 80.0–100.0)
Platelets: 251 10*3/uL (ref 150–400)
RBC: 4.25 MIL/uL (ref 3.87–5.11)
RDW: 12.7 % (ref 11.5–15.5)
WBC: 8 10*3/uL (ref 4.0–10.5)
nRBC: 0 % (ref 0.0–0.2)

## 2021-06-07 LAB — D-DIMER, QUANTITATIVE: D-Dimer, Quant: 0.37 ug/mL-FEU (ref 0.00–0.50)

## 2021-06-07 IMAGING — CR DG CHEST 2V
2 series · 2 of 2 positions shown · non-contrast
Comparison: [DATE]

CLINICAL DATA: Chest pain

EXAM:
CHEST - 2 VIEW

[chest pa]
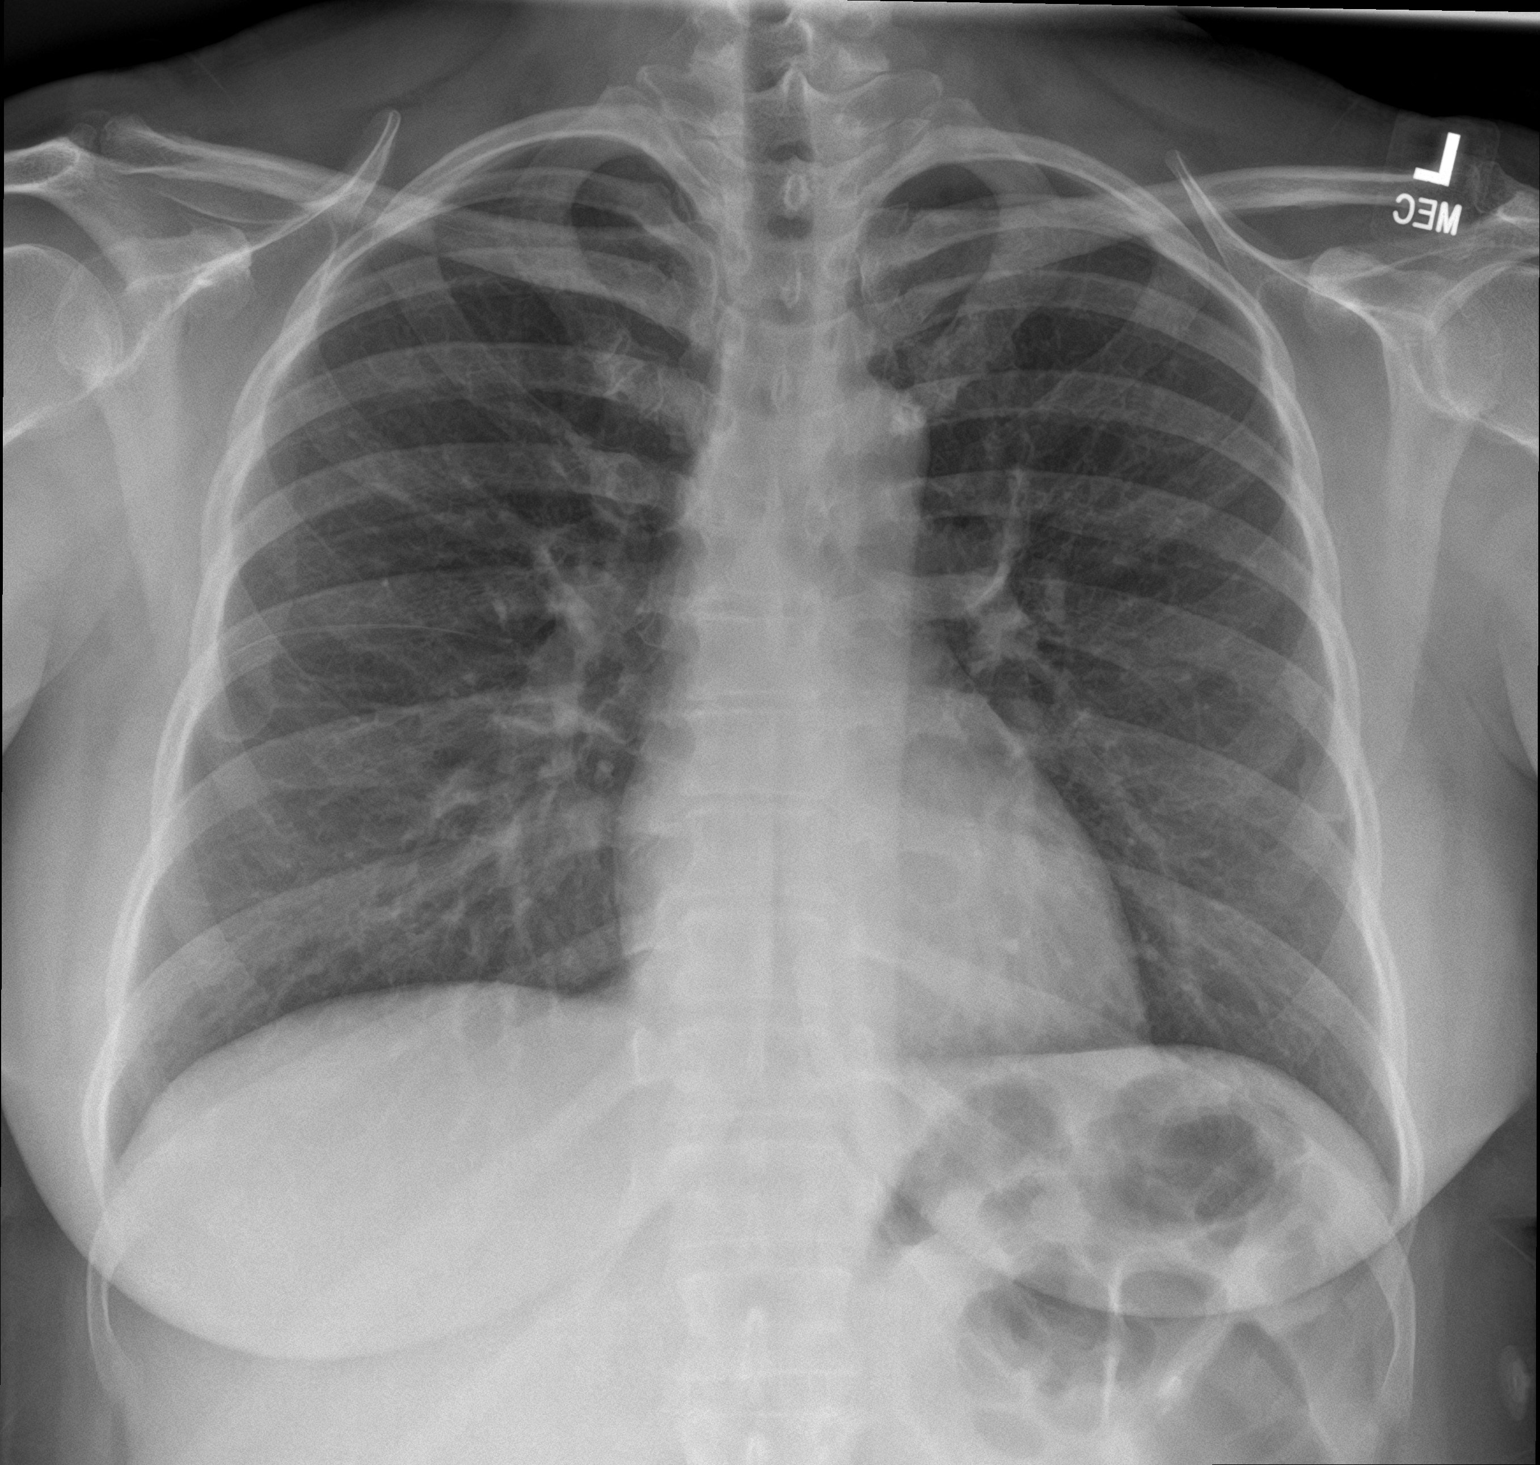

[chest lat]
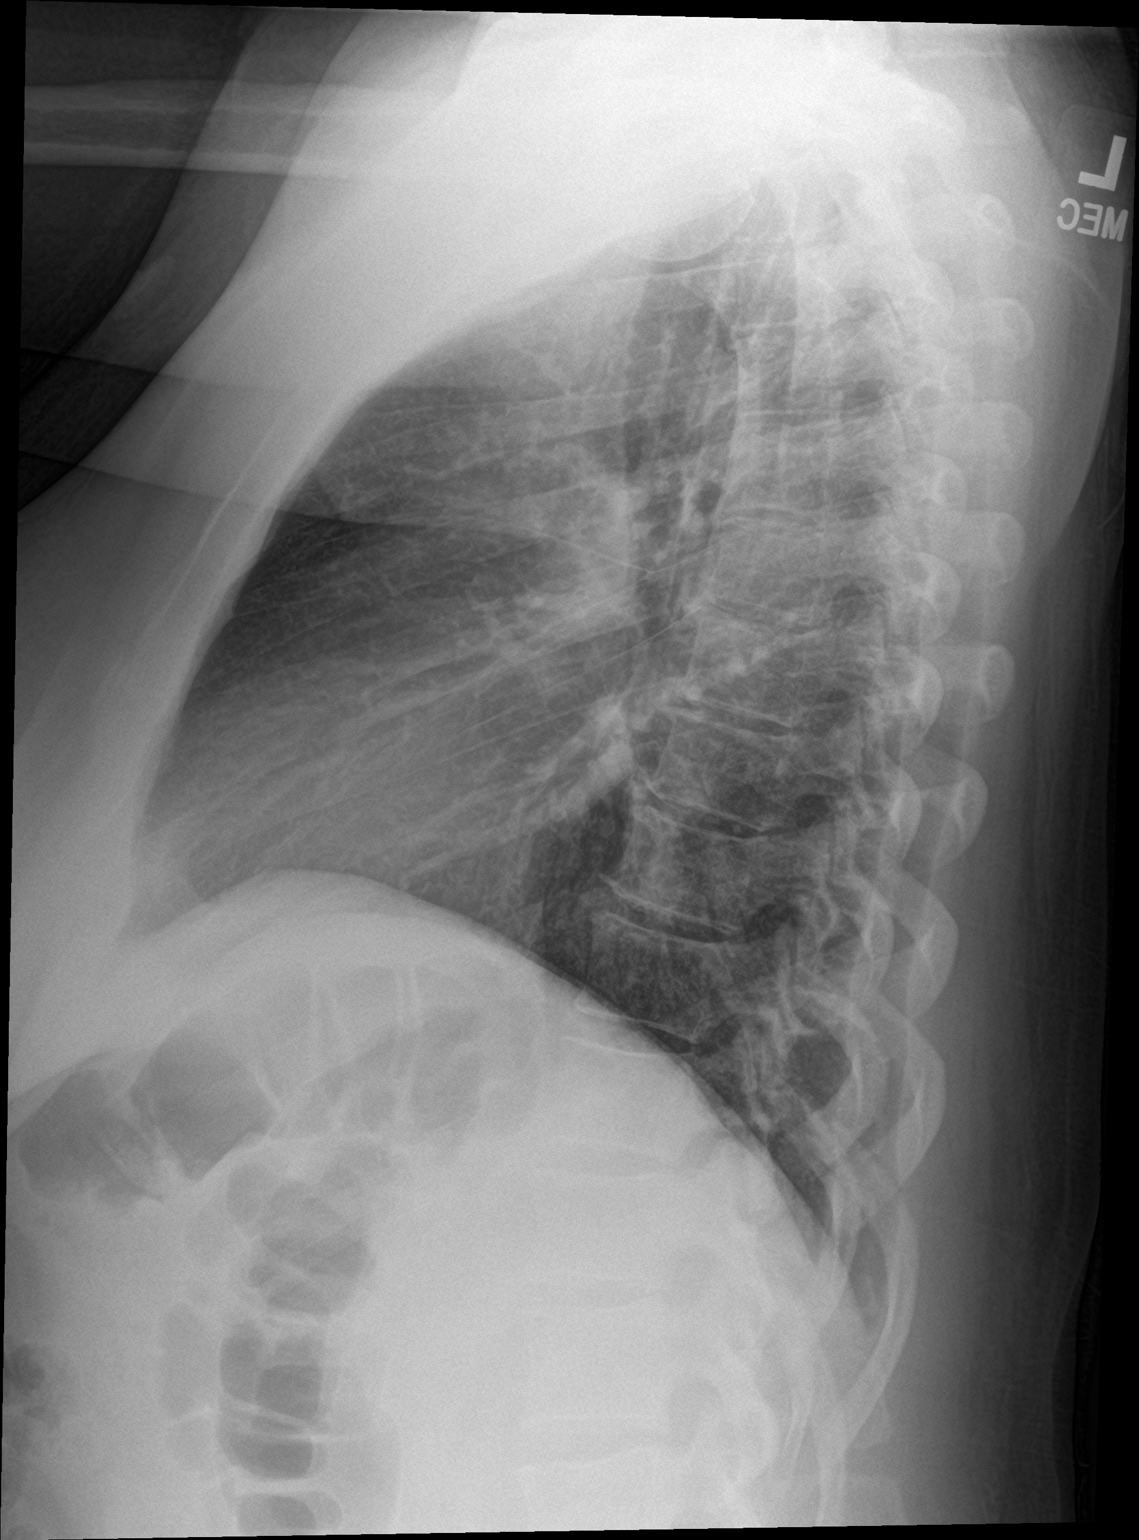

[2 of 2 positions shown; findings below may reference images not displayed]

FINDINGS: The heart size and mediastinal contours are within normal limits. No
focal consolidation. No pleural effusion. No pneumothorax. The
visualized skeletal structures are unremarkable.
IMPRESSION: No active cardiopulmonary disease.

## 2021-06-07 MED ORDER — MORPHINE SULFATE (PF) 4 MG/ML IV SOLN
4.0000 mg | Freq: Once | INTRAVENOUS | Status: AC
  Administered 2021-06-07: 4 mg via INTRAVENOUS
  Filled 2021-06-07: qty 1

## 2021-06-07 MED ORDER — ASPIRIN 81 MG PO CHEW
324.0000 mg | CHEWABLE_TABLET | Freq: Once | ORAL | Status: AC
  Administered 2021-06-07: 324 mg via ORAL
  Filled 2021-06-07: qty 4

## 2021-06-07 MED ORDER — POTASSIUM CHLORIDE CRYS ER 20 MEQ PO TBCR: 40.0000 meq | EXTENDED_RELEASE_TABLET | Freq: Once | ORAL | Status: DC

## 2021-06-07 MED ORDER — ONDANSETRON HCL 4 MG/2ML IJ SOLN
4.0000 mg | Freq: Once | INTRAMUSCULAR | Status: AC
  Administered 2021-06-07: 4 mg via INTRAVENOUS
  Filled 2021-06-07: qty 2

## 2021-06-07 NOTE — Discharge Instructions (Signed)
Your troponins were negative but your story is concerning.  We discussed admission for stress test versus doing outpatient.  Given your pain was getting better she preferred to do it outpatient.  She should return to the ER if you develop return of symptoms or have any other concerns.  Cardiology is making appointment for you tomorrow.  Please call their office tomorrow to confirm at time.

## 2021-06-07 NOTE — ED Triage Notes (Signed)
Pt here with CP that started at 1500. Pt took 2 nitro that did not help. Pt states that pain is left sided and was sharp. Pt denies N/V/D. Pt had a hx of angina. Pt in NAD in triage.

## 2021-06-07 NOTE — ED Provider Notes (Signed)
[facility] Emergency Department Provider Note  ____________________________________________   Event Date/Time   First MD Initiated Contact with Patient 06/07/21 1811     (approximate)  I have reviewed the triage vital signs and the nursing notes.   HISTORY  Chief Complaint Chest Pain    HPI Jody Gonzalez is a 50 y.o. female with diabetes, HTN, who comes in with chest pain that started at 3pm.  Patient reports that she has a history of angina that she will take nitro for.  Last used 4 months ago.  Typically after taking the nitro her pain goes away.  However this time her pain started at 3 PM and the pain is not going away with the nitro.  She reports having taken 2 nitro.  She denies straining anything.  She reports it is a sharp stabbing sensation on the left side of her chest, nonradiating, does have a pleuritic component but denies feeling short of breath.  She denies any leg swelling, concern for blood clots.  Patient has been seen by cardiology previously.  Denies ever having a cath done.  On review of records from [facility] cardiology it appears at one-point years ago they thought she might of had an inferior MI but then she never had a catheter no stents were placed and repeat EKGs all have looked okay without any Q waves.  Therefore patient never had any recent kind of stress testing or anything like that.  Patient is on any blood thinners.        Past Medical History:  Diagnosis Date   Diabetes mellitus without complication ([facility])    Hyperlipidemia    Hypertension    Type 2 diabetes mellitus [facility])     Patient Active Problem List   Diagnosis Date Noted   Complaint of headache    Word finding difficulty    DM type 2 (diabetes mellitus, type 2) ([place]) 08/25/2019   Migraine 08/25/2019   TIA (transient ischemic attack) 08/25/2019   Chest pain, moderate coronary artery risk 07/14/2019   Essential hypertension 07/14/2019   Hyperlipidemia LDL goal <70  07/14/2019    No past surgical history on file.  Prior to Admission medications   Medication Sig Start Date End Date Taking? Authorizing Provider  aspirin EC 81 MG tablet Take 1 tablet (81 mg total) by mouth daily. 07/14/19   End, Harrell Gave, MD  atorvastatin (LIPITOR) 80 MG tablet Take 80 mg by mouth every evening.     [provider]  Conj Estrog-Medroxyprogest Ace (PREMPRO PO) Take 1 tablet by mouth daily.    [provider]  Exenatide ER (BYDUREON BCISE) 2 MG/0.85ML AUIJ Inject 2 mg into the skin every Saturday.     [provider]  metFORMIN (GLUCOPHAGE) 1000 MG tablet Take 1 tablet (1,000 mg total) by mouth 2 (two) times daily with a meal. 06/19/19 06/18/20  Earleen Newport, MD  Multiple Vitamins-Minerals (HAIR SKIN AND NAILS FORMULA) TABS Take 1 tablet by mouth as directed.    [provider]  nitroGLYCERIN (NITROSTAT) 0.4 MG SL tablet Place 1 tablet (0.4 mg total) under the tongue every 5 (five) minutes as needed for chest pain. Maximum of 3 doses. 05/24/20   End, Harrell Gave, MD  nortriptyline (PAMELOR) 25 MG capsule Take 1 capsule (25 mg total) by mouth at bedtime. 08/26/19 08/25/20  Loletha Grayer, MD  telmisartan (MICARDIS) 40 MG tablet Take 0.5 tablets (20 mg total) by mouth daily. 08/26/19   Loletha Grayer, MD  Allergies Azithromycin, Imitrex [sumatriptan], and Lisinopril  Family History  Problem Relation Age of Onset   Diabetes Mother    Heart attack Mother 67   Hyperlipidemia Father    Hypertension Father    Heart attack Sister 31    Social History Social History   Tobacco Use   Smoking status: Never   Smokeless tobacco: Never  Substance Use Topics   Alcohol use: Yes    Alcohol/week: 2.0 standard drinks    Types: 2 Cans of beer per week   Drug use: Never      Review of Systems Constitutional: No fever/chills Eyes: No visual changes. ENT: No sore throat. Cardiovascular: Positive chest pain Respiratory: Denies  shortness of breath.  Pleuritic pain Gastrointestinal: No abdominal pain.  No nausea, no vomiting.  No diarrhea.  No constipation. Genitourinary: Negative for dysuria. Musculoskeletal: Negative for back pain. Skin: Negative for rash. Neurological: Negative for headaches, focal weakness or numbness. All other ROS negative ____________________________________________   PHYSICAL EXAM:  VITAL SIGNS: ED Triage Vitals [06/07/21 1632]  Enc Vitals Group     BP (!) 144/81     Pulse Rate 71     Resp 16     Temp 98.1 F (36.7 C)     Temp Source Oral     SpO2 97 %     Weight 166 lb (75.3 kg)     Height 5\' 3"  (1.6 m)     Head Circumference      Peak Flow      Pain Score 8     Pain Loc      Pain Edu?      Excl. in [place]?     Constitutional: Alert and oriented. Well appearing and in no acute distress. Eyes: Conjunctivae are normal. EOMI. Head: Atraumatic. Nose: No congestion/rhinnorhea. Mouth/Throat: Mucous membranes are moist.   Neck: No stridor. Trachea Midline. FROM Cardiovascular: Normal rate, regular rhythm. Grossly normal heart sounds.  Good peripheral circulation.  No chest wall tenderness Respiratory: Normal respiratory effort.  No retractions. Lungs CTAB. Gastrointestinal: Soft and nontender. No distention. No abdominal bruits.  Musculoskeletal: No lower extremity tenderness nor edema.  No joint effusions. Neurologic:  Normal speech and language. No gross focal neurologic deficits are appreciated.  Skin:  Skin is warm, dry and intact. No rash noted. Psychiatric: Mood and affect are normal. Speech and behavior are normal. GU: Deferred   ____________________________________________   LABS (all labs ordered are listed, but only abnormal results are displayed)  Labs Reviewed  BASIC METABOLIC PANEL - Abnormal; Notable for the following components:      Result Value   Potassium 3.3 (*)    Glucose, Bld 196 (*)    All other components within normal limits  CBC  POC URINE  PREG, ED  TROPONIN I (HIGH SENSITIVITY)  TROPONIN I (HIGH SENSITIVITY)   ____________________________________________   ED ECG REPORT I, Vanessa [place], the attending physician, personally viewed and interpreted this ECG.  Normal sinus rate of 69, no ST elevation, no T wave versions, normal intervals  Normal sinus rate of 69, no ST elevation, no T wave inversions, normal intervals.  There are reading prolonged QTC but is really difficult to really see her T waves.  They also read some minimal ST elevation in inferior leads but it looks very similar to her earlier EKG does not really seem to be elevated but just seems elevated from small q wave in inferior leads. To note she has had these similar Q  waves in prior EKGS.  ____________________________________________  RADIOLOGY Robert Bellow, personally viewed and evaluated these images (plain radiographs) as part of my medical decision making, as well as reviewing the written report by the radiologist.  ED MD interpretation: No pneumonia  Official radiology report(s): DG Chest 2 View  Result Date: 06/07/2021 CLINICAL DATA:  Chest pain EXAM: CHEST - 2 VIEW COMPARISON:  June 19, 2019 FINDINGS: The heart size and mediastinal contours are within normal limits. No focal consolidation. No pleural effusion. No pneumothorax. The visualized skeletal structures are unremarkable. IMPRESSION: No active cardiopulmonary disease. Electronically Signed   By: Dahlia Bailiff M.D.   On: 06/07/2021 16:59    ____________________________________________   PROCEDURES  Procedure(s) performed (including Critical Care):  Procedures   ____________________________________________   INITIAL IMPRESSION / ASSESSMENT AND PLAN / ED COURSE   Kamini Nist was evaluated in Emergency Department on 06/07/2021 for the symptoms described in the history of present illness. She was evaluated in the context of the global COVID-19 pandemic, which necessitated  consideration that the patient might be at risk for infection with the SARS-CoV-2 virus that causes COVID-19. Institutional protocols and algorithms that pertain to the evaluation of patients at risk for COVID-19 are in a state of rapid change based on information released by regulatory bodies including the CDC and federal and state organizations. These policies and algorithms were followed during the patient's care in the ED.    Most Likely DDx:  -Consider unstable angina although the description of pain seems a little atypical but given she does have diabetes and a lot of risk factors can definitely be cardiovascular in nature.  Initial troponin was negative but will need repeat to the onset of symptoms.  We will also get a D-dimer given pleuritic component although overall low risk.  DDx that was also considered d/t potential to cause harm, but was found less likely based on history and physical (as detailed above): -PNA (no fevers, cough but CXR to evaluate) -PNX (reassured with equal b/l breath sounds, CXR to evaluate) -Symptomatic anemia (will get H&H) -Pulmonary embolism as no sob at rest, not pleuritic in nature, no hypoxia -Aortic Dissection as no tearing pain and no radiation to the mid back, pulses equal -Pericarditis no rub on exam, EKG changes or hx to suggest dx -Tamponade (no notable SOB, tachycardic, hypotensive) -Esophageal rupture (no h/o diffuse vomitting/no crepitus)  Initial troponin is 3  D-dimer is negative.  Repeat troponin is negative.   Discussed with family admission for observation, stress test inpatient versus follow-up outpatient with cardiology.  They would prefer to follow-up outpatient.  I did discuss the case with Dr. Bea Laura who should be able to get him in tomorrow.  She understands that if the pain is coming back and is severe she needs to return to the ER for repeat evaluation.  Reevaluated patient's pain.  Patient is nearly gone.  EKG is unchanged from  earlier.  2 troponins have been negative.  Again I offered admission for stress test.  I have talked to cardiology who can get her in tomorrow and they prefer to do that critical comfortable with discharge home but will return if the chest pain is returning  I discussed the provisional nature of ED diagnosis, the treatment so far, the ongoing plan of care, follow up appointments and return precautions with the patient and any family or support people present. They expressed understanding and agreed with the plan, discharged home.  ____________________________________________   FINAL CLINICAL IMPRESSION(S) / ED DIAGNOSES   Final diagnoses:  Chest pain, unspecified type     MEDICATIONS GIVEN DURING THIS VISIT:  Medications  aspirin chewable tablet 324 mg (324 mg Oral Given 06/07/21 1909)  morphine 4 MG/ML injection 4 mg (4 mg Intravenous Given 06/07/21 1913)  ondansetron (ZOFRAN) injection 4 mg (4 mg Intravenous Given 06/07/21 1913)  morphine 4 MG/ML injection 4 mg (4 mg Intravenous Given 06/07/21 2035)     ED Discharge Orders     None        Note:  This document was prepared using Dragon voice recognition software and may include unintentional dictation errors.    Concha Se, MD 06/07/21 2112

## 2021-10-04 ENCOUNTER — Emergency Department

## 2021-10-04 ENCOUNTER — Emergency Department
Admission: EM | Admit: 2021-10-04 | Discharge: 2021-10-05 | Disposition: A | Discharge status: Home / Self Care | Attending: Emergency Medicine | Admitting: Emergency Medicine

## 2021-10-04 ENCOUNTER — Other Ambulatory Visit: Payer: Self-pay

## 2021-10-04 DIAGNOSIS — E119 Type 2 diabetes mellitus without complications: Secondary | ICD-10-CM | POA: Insufficient documentation

## 2021-10-04 DIAGNOSIS — I10 Essential (primary) hypertension: Secondary | ICD-10-CM | POA: Diagnosis not present

## 2021-10-04 DIAGNOSIS — R519 Headache, unspecified: Secondary | ICD-10-CM | POA: Insufficient documentation

## 2021-10-04 DIAGNOSIS — R4701 Aphasia: Secondary | ICD-10-CM | POA: Insufficient documentation

## 2021-10-04 LAB — DIFFERENTIAL
Abs Immature Granulocytes: 0.03 10*3/uL (ref 0.00–0.07)
Basophils Absolute: 0.1 10*3/uL (ref 0.0–0.1)
Basophils Relative: 1 %
Eosinophils Absolute: 0.1 10*3/uL (ref 0.0–0.5)
Eosinophils Relative: 2 %
Immature Granulocytes: 1 %
Lymphocytes Relative: 41 %
Lymphs Abs: 2.3 10*3/uL (ref 0.7–4.0)
Monocytes Absolute: 0.5 10*3/uL (ref 0.1–1.0)
Monocytes Relative: 8 %
Neutro Abs: 2.7 10*3/uL (ref 1.7–7.7)
Neutrophils Relative %: 47 %

## 2021-10-04 LAB — COMPREHENSIVE METABOLIC PANEL
ALT: 37 U/L (ref 0–44)
AST: 22 U/L (ref 15–41)
Albumin: 4.5 g/dL (ref 3.5–5.0)
Alkaline Phosphatase: 123 U/L (ref 38–126)
Anion gap: 9 (ref 5–15)
BUN: 12 mg/dL (ref 6–20)
CO2: 25 mmol/L (ref 22–32)
Calcium: 9.6 mg/dL (ref 8.9–10.3)
Chloride: 101 mmol/L (ref 98–111)
Creatinine, Ser: 0.58 mg/dL (ref 0.44–1.00)
GFR, Estimated: >60 mL/min (ref >60)
Glucose, Bld: 264 mg/dL — ABNORMAL HIGH (ref 70–99)
Potassium: 3.8 mmol/L (ref 3.5–5.1)
Sodium: 135 mmol/L (ref 135–145)
Total Bilirubin: 0.7 mg/dL (ref 0.3–1.2)
Total Protein: 7.3 g/dL (ref 6.5–8.1)

## 2021-10-04 LAB — CBC
HCT: 40.9 % (ref 36.0–46.0)
Hemoglobin: 14.2 g/dL (ref 12.0–15.0)
MCH: 31.2 pg (ref 26.0–34.0)
MCHC: 34.7 g/dL (ref 30.0–36.0)
MCV: 89.9 fL (ref 80.0–100.0)
Platelets: 228 10*3/uL (ref 150–400)
RBC: 4.55 MIL/uL (ref 3.87–5.11)
RDW: 12.8 % (ref 11.5–15.5)
WBC: 5.7 10*3/uL (ref 4.0–10.5)
nRBC: 0 % (ref 0.0–0.2)

## 2021-10-04 LAB — APTT: aPTT: 24 seconds (ref 24–36)

## 2021-10-04 LAB — PROTIME-INR
INR: 0.9 (ref 0.8–1.2)
Prothrombin Time: 11.9 seconds (ref 11.4–15.2)

## 2021-10-04 IMAGING — CT CT HEAD W/O CM
4 series · 16 of 47 positions shown, 18 images · non-contrast
Comparison: None.

CLINICAL DATA: No logical deficit and headache



[Series 2: head bone · axial · 0.40mm/px · z∈[-71,-43]mm · 3 of 73 slices shown]
[im 8/73  bone]
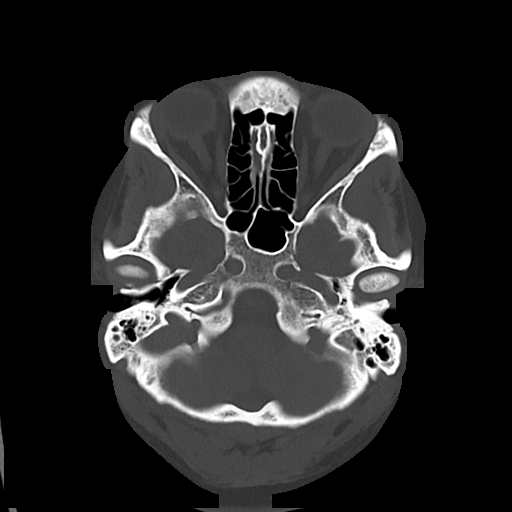
[im 15/73  bone]
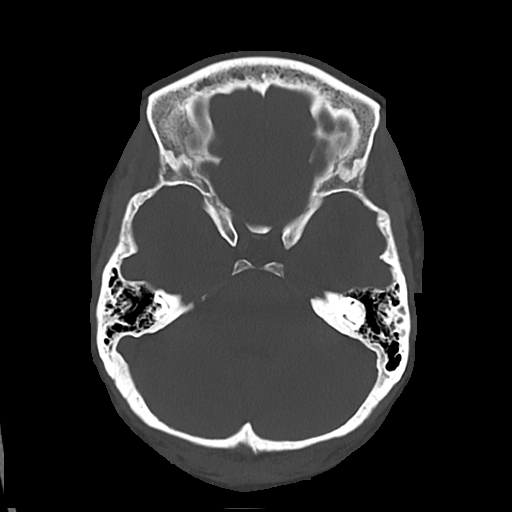
[im 22/73  bone]
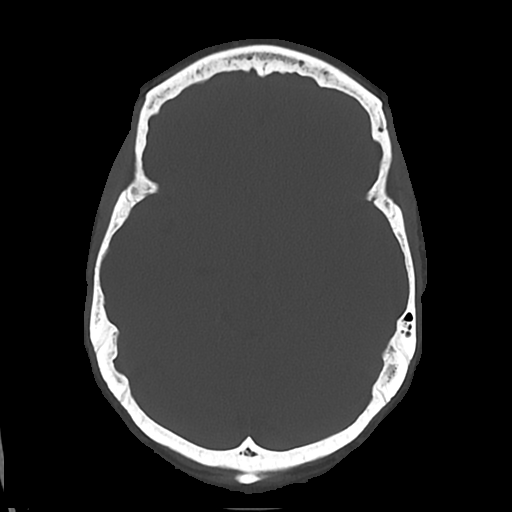

[Series 3: head wo · axial · 0.40mm/px · z∈[-70,+35]mm · 7 of 29 slices shown, 9 images]
[im 4/29  brain]
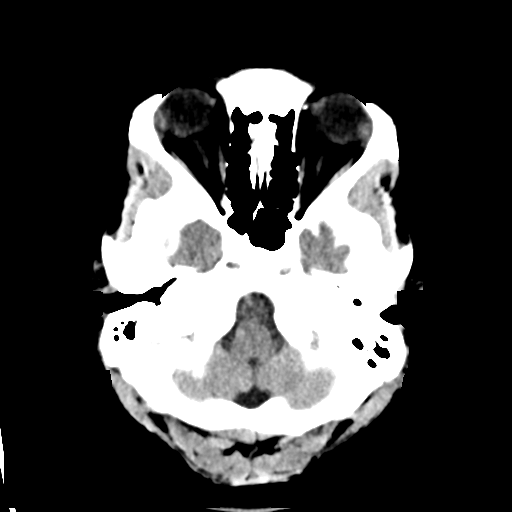
[im 4/29  bone]
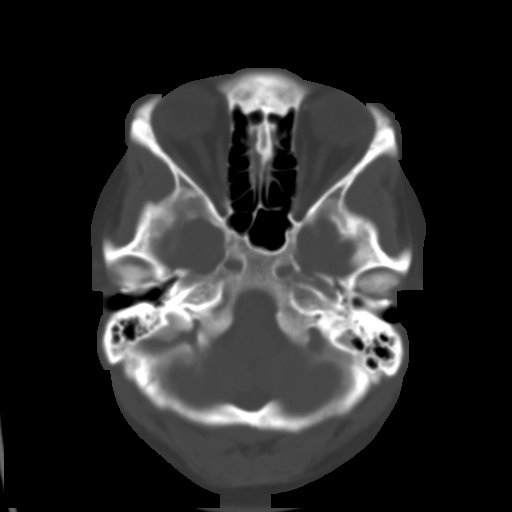
[im 8/29  brain]
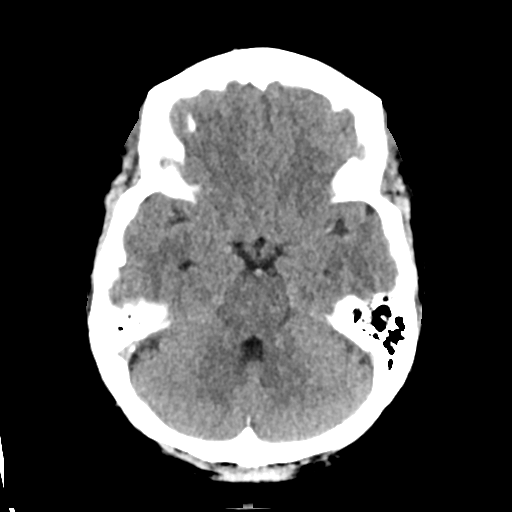
[im 11/29  brain]
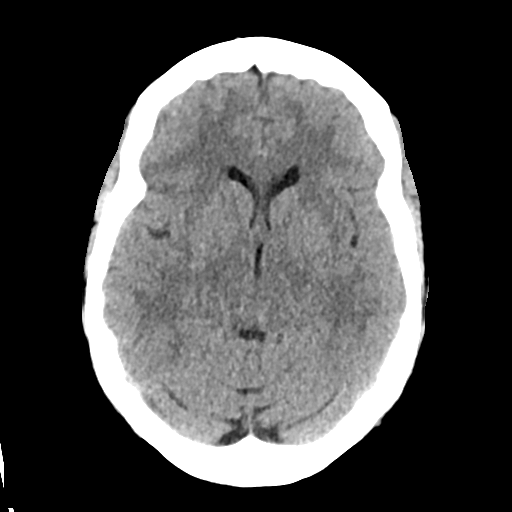
[im 15/29  brain]
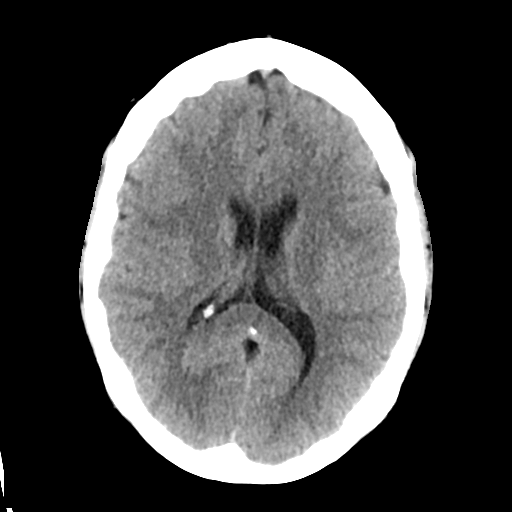
[im 18/29  brain]
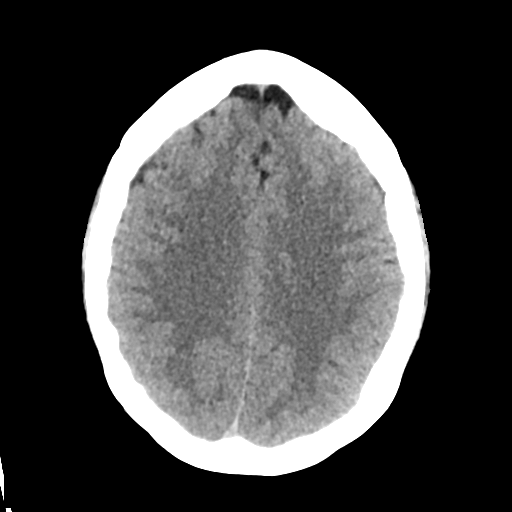
[im 18/29  bone]
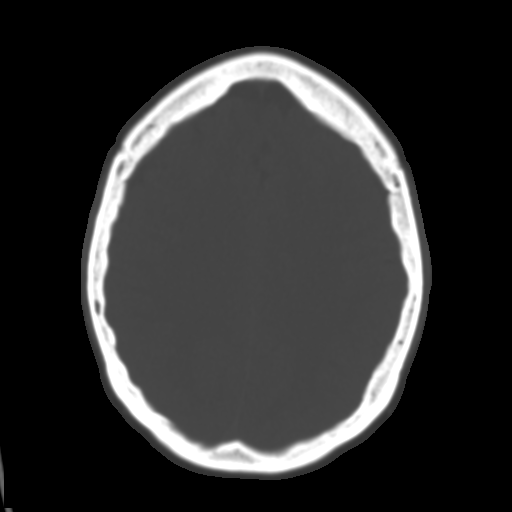
[im 22/29  brain]
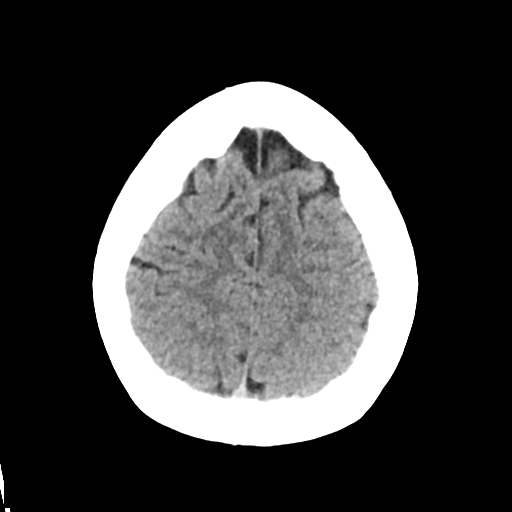
[im 25/29  brain]
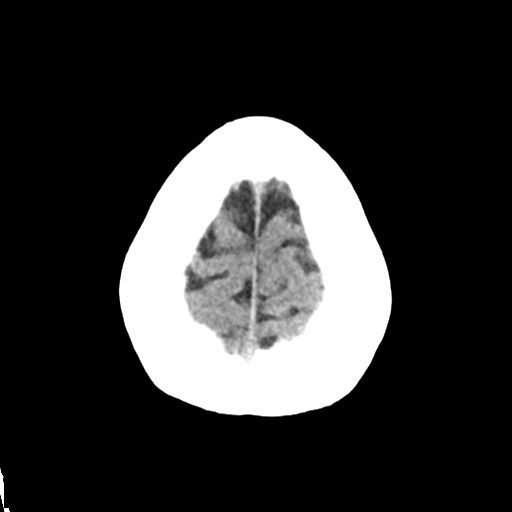

[Series 4: cor soft · coronal · 0.29mm/px · 3 of 63 slices shown]
[im 21/63  brain]
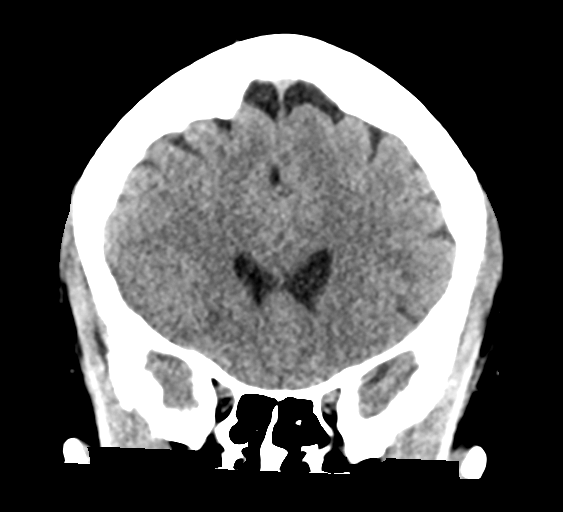
[im 28/63  brain]
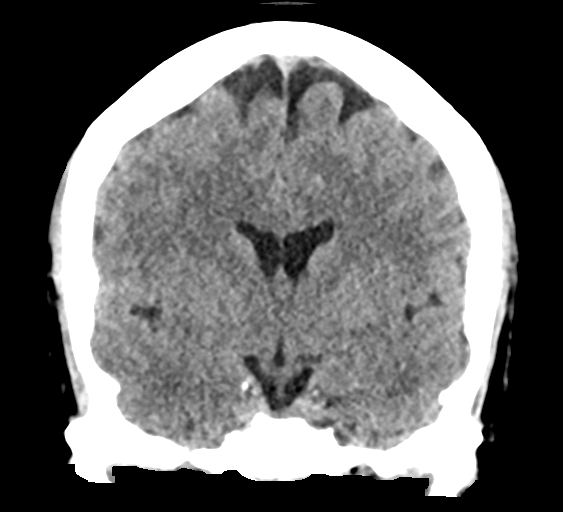
[im 35/63  brain]
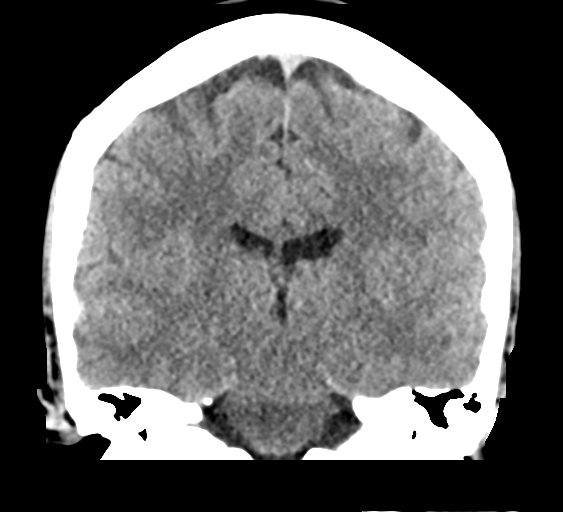

[Series 5: sag soft · sagittal · 0.29mm/px · 3 of 56 slices shown]
[im 19/56  brain]
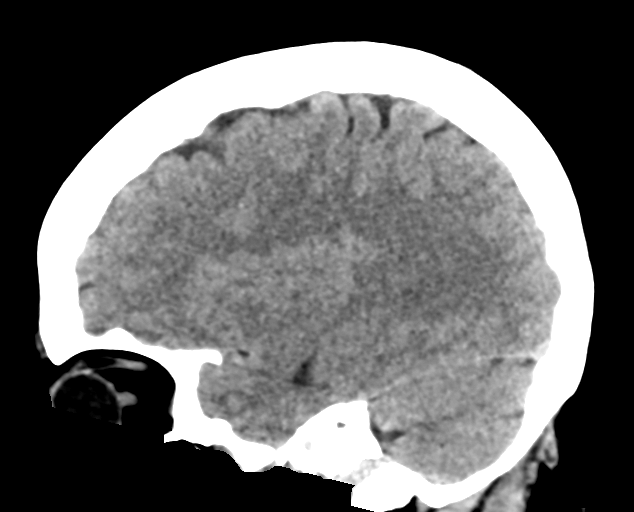
[im 28/56  brain]
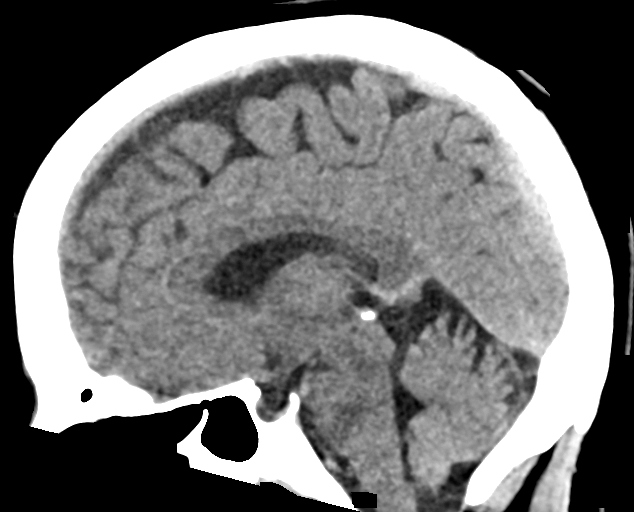
[im 37/56  brain]
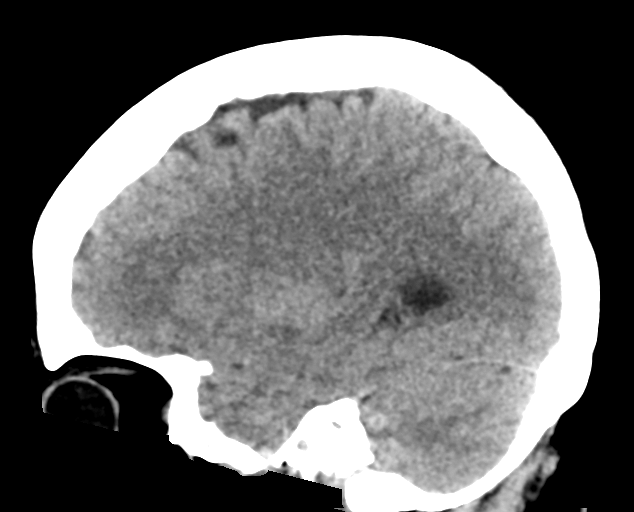

[16 of 47 positions shown; findings below may reference images not displayed]

FINDINGS: Brain: No evidence of acute infarction, hemorrhage, hydrocephalus,
extra-axial collection or mass lesion/mass effect.

Vascular: No hyperdense vessel or unexpected calcification.

Skull: Normal. Negative for fracture or focal lesion.

Sinuses/Orbits: No acute finding.

Other: None.
IMPRESSION: No acute intracranial abnormality.

## 2021-10-04 IMAGING — MR MR MRA NECK WO/W CM
1 of 2 series · 20 of 48 positions shown · IV contrast (gadavist)
Comparison: CTA neck [DATE]

EXAM:
MRI HEAD WITHOUT CONTRAST

MRA HEAD WITHOUT CONTRAST
MRA NECK WITHOUT AND WITH CONTRAST
TECHNIQUE: Multiplanar, multi-echo pulse sequences of the brain and surrounding
structures were acquired without intravenous contrast. Angiographic
images of the Circle of Willis were acquired using MRA technique
without intravenous contrast. Angiographic images of the neck were
acquired using MRA technique without and with intravenous contrast.
Carotid stenosis measurements (when applicable) are obtained
utilizing NASCET criteria, using the distal internal carotid
diameter as the denominator.
CONTRAST:  7mL GADAVIST GADOBUTROL 1 MMOL/ML IV SOLN

[Series 13: angio_fl3d_cor_post_ttc=2.0s_moco-adv · coronal · B · 0.9mm · 0.85mm/px · 20 of 93 slices shown]
[im 1/93]
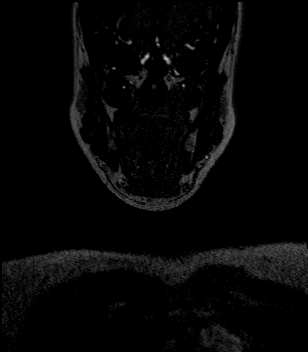
[im 5/93]
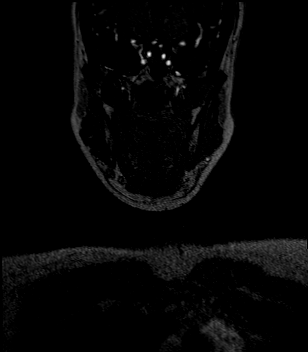
[im 10/93]
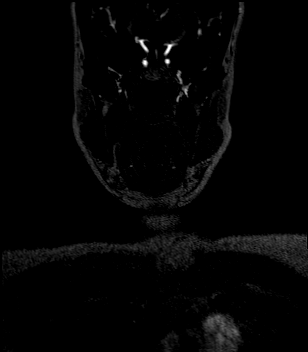
[im 15/93]
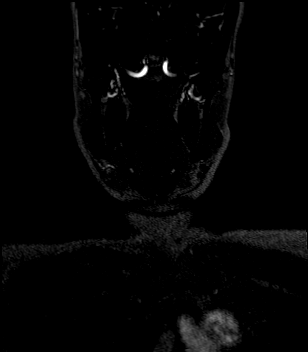
[im 20/93]
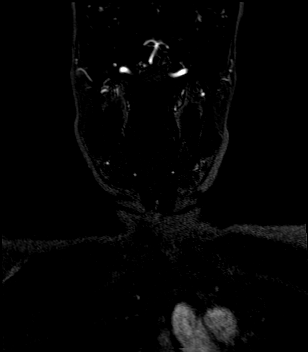
[im 25/93]
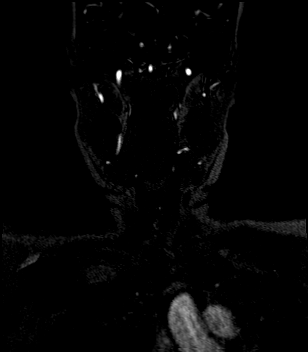
[im 30/93]
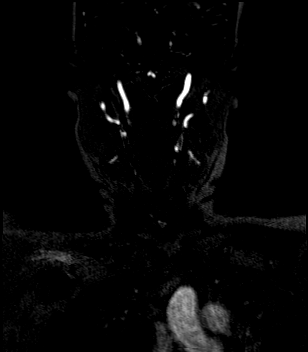
[im 34/93]
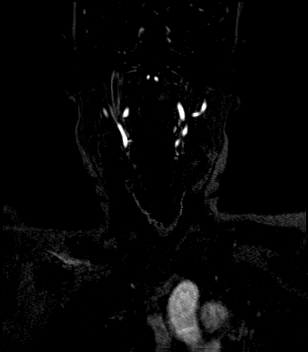
[im 39/93]
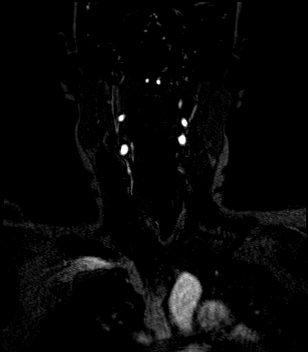
[im 44/93]
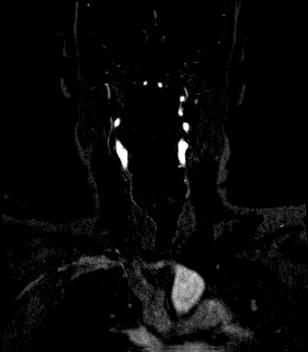
[im 49/93]
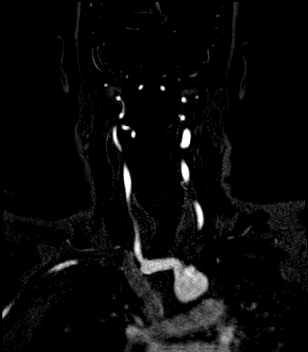
[im 54/93]
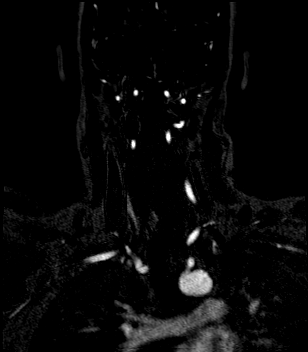
[im 59/93]
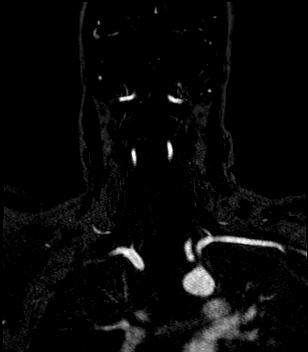
[im 63/93]
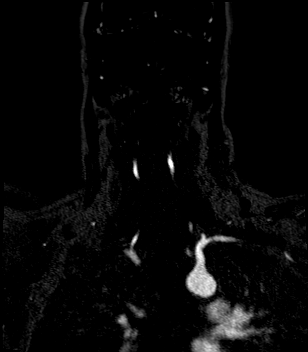
[im 68/93]
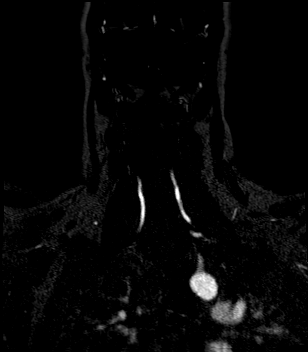
[im 73/93]
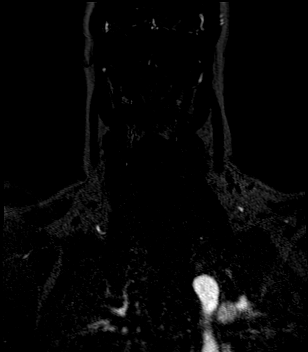
[im 78/93]
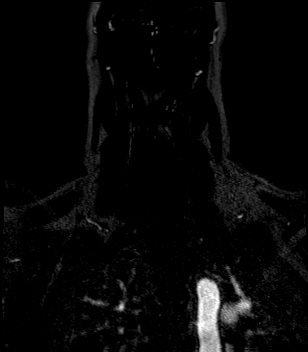
[im 83/93]
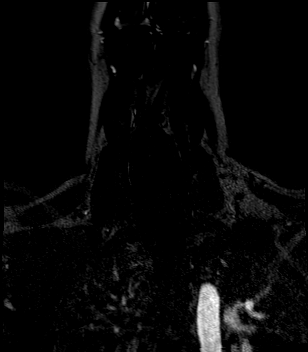
[im 88/93]
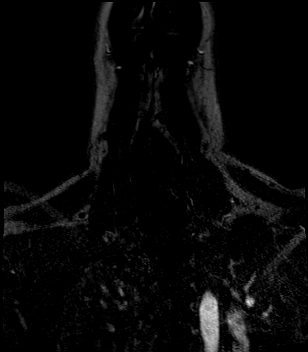
[im 93/93]
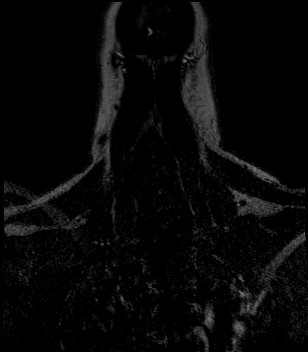

[20 of 48 positions shown; findings below may reference images not displayed]

FINDINGS: MRI HEAD FINDINGS

Brain: No acute infarct, mass effect or extra-axial collection. No
acute or chronic hemorrhage. Minimal multifocal hyperintense
T2-weight signal within the white matter. The midline structures are
normal.

Vascular: Major flow voids are preserved.

Skull and upper cervical spine: Normal calvarium and skull base.
Visualized upper cervical spine and soft tissues are normal.

Sinuses/Orbits:No paranasal sinus fluid levels or advanced mucosal
thickening. No mastoid or middle ear effusion. Normal orbits.

MRA HEAD FINDINGS

POSTERIOR CIRCULATION:

--Vertebral arteries: Normal

--Inferior cerebellar arteries: Normal.

--Basilar artery: Normal.

--Superior cerebellar arteries: Normal.

--Posterior cerebral arteries: Normal.

ANTERIOR CIRCULATION:

--Intracranial internal carotid arteries: Normal.

--Anterior cerebral arteries (ACA): Normal.

--Middle cerebral arteries (MCA): Normal.

ANATOMIC VARIANTS: None

MRA NECK FINDINGS

Aortic arch: Normal

Right carotid system: Normal without stenosis

Left carotid system: Normal without stenosis

Vertebral arteries: Codominant and normal

Other: None
IMPRESSION: 1. No acute intracranial abnormality.
2. Normal MRA of the head and neck.

## 2021-10-04 IMAGING — MR MR MRA HEAD W/O CM
1 series · 26 of 48 positions shown · IV contrast (gadavist)
Comparison: CTA neck [DATE]

EXAM:
MRI HEAD WITHOUT CONTRAST

MRA HEAD WITHOUT CONTRAST
MRA NECK WITHOUT AND WITH CONTRAST
TECHNIQUE: Multiplanar, multi-echo pulse sequences of the brain and surrounding
structures were acquired without intravenous contrast. Angiographic
images of the Circle of Willis were acquired using MRA technique
without intravenous contrast. Angiographic images of the neck were
acquired using MRA technique without and with intravenous contrast.
Carotid stenosis measurements (when applicable) are obtained
utilizing NASCET criteria, using the distal internal carotid
diameter as the denominator.
CONTRAST:  7mL GADAVIST GADOBUTROL 1 MMOL/ML IV SOLN

[Series 9: TOF · axial · 0.5mm · 0.48mm/px · z∈[-117,-26]mm · 26 of 217 slices shown]
[im 1/217]
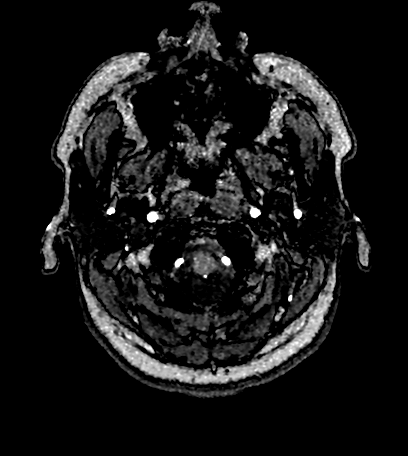
[im 5/217]
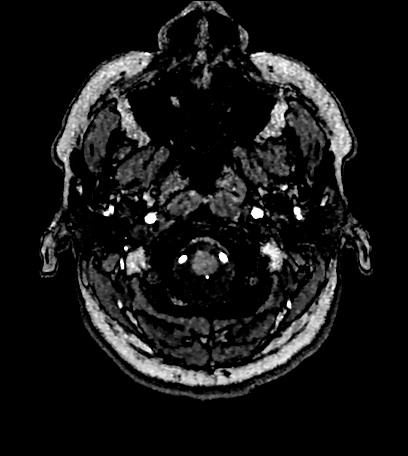
[im 10/217]
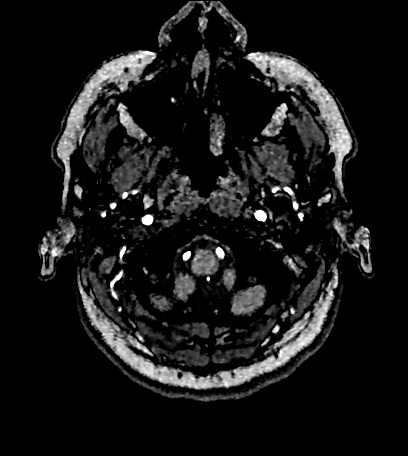
[im 14/217]
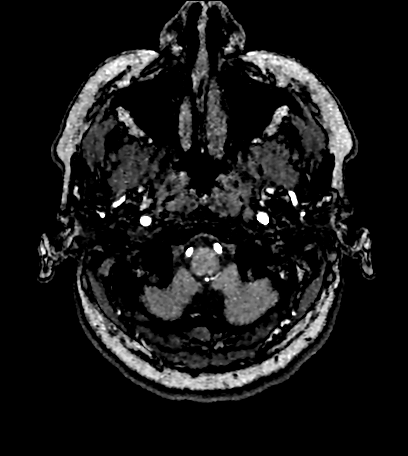
[im 19/217]
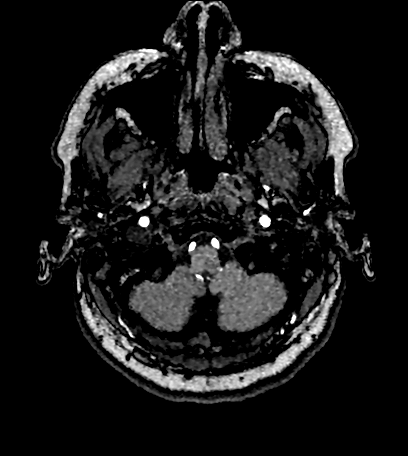
[im 23/217]
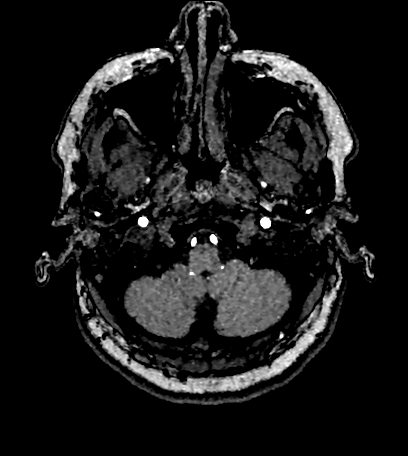
[im 28/217]
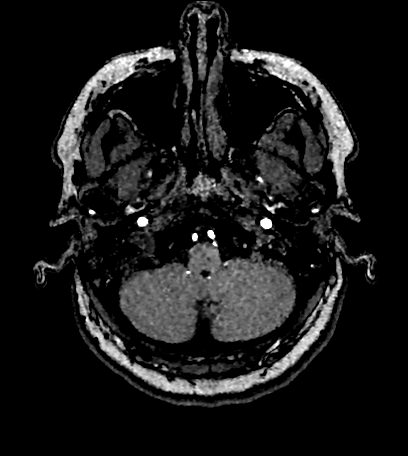
[im 33/217]
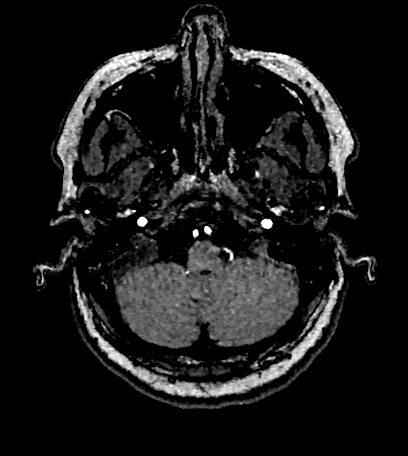
[im 37/217]
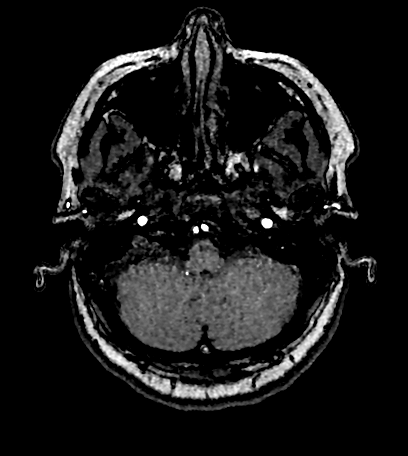
[im 42/217]
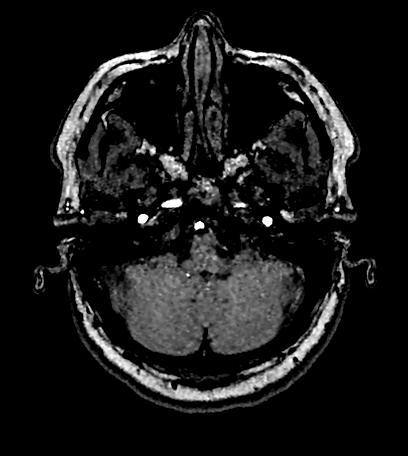
[im 46/217]
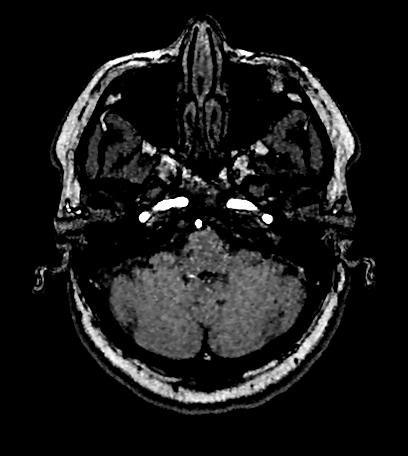
[im 51/217]
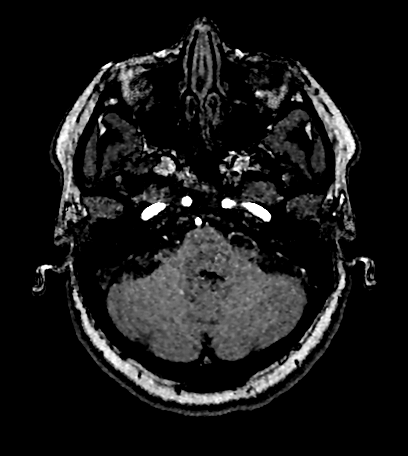
[im 56/217]
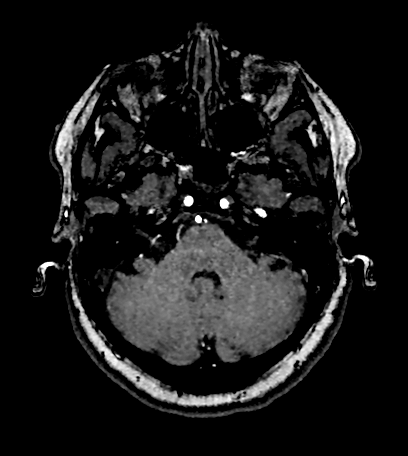
[im 60/217]
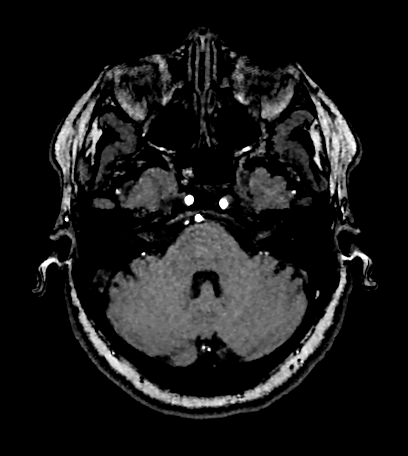
[im 65/217]
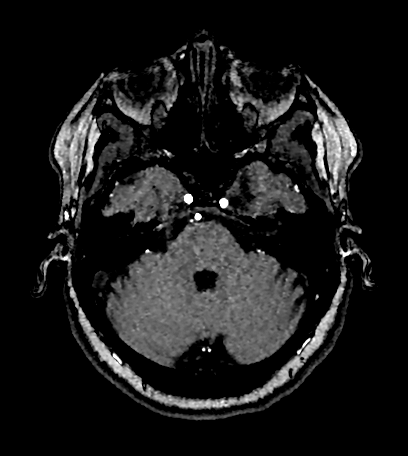
[im 69/217]
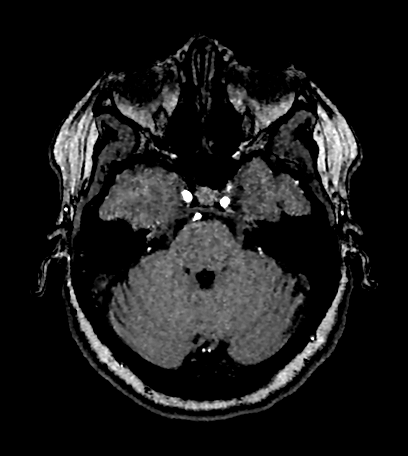
[im 74/217]
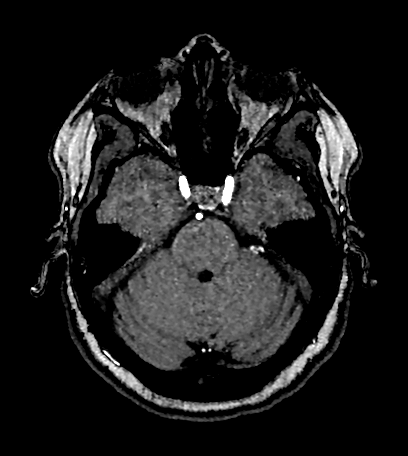
[im 79/217]
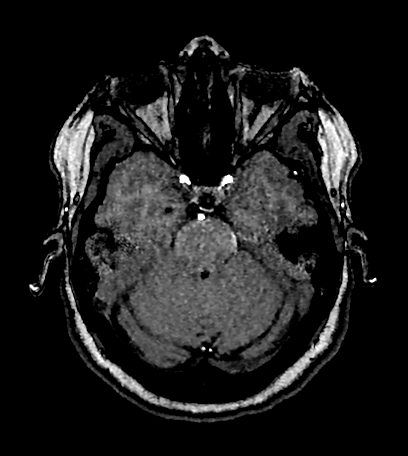
[im 83/217]
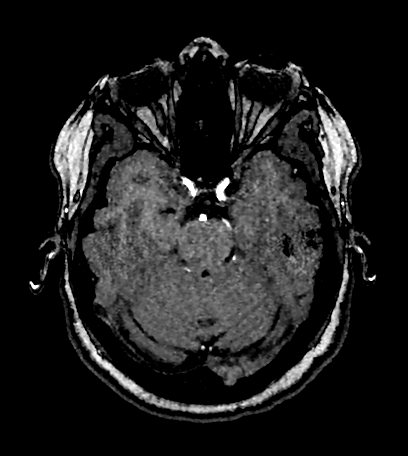
[im 97/217]
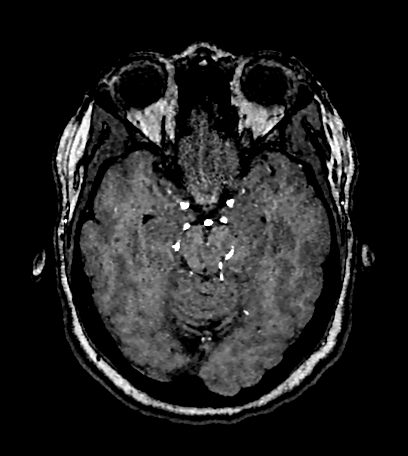
[im 111/217]
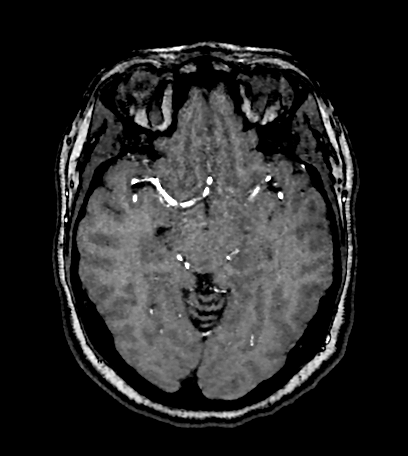
[im 125/217]
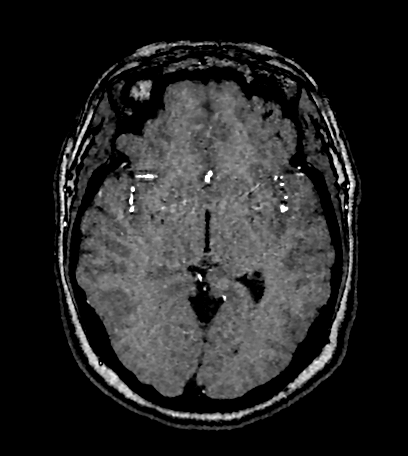
[im 152/217]
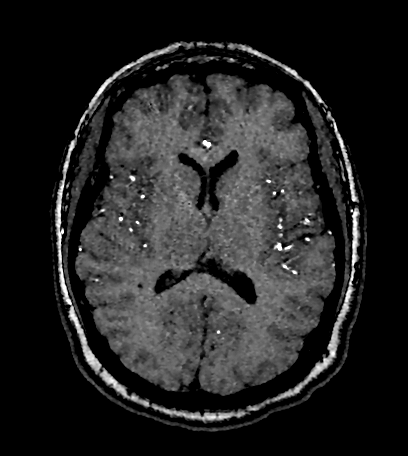
[im 180/217]
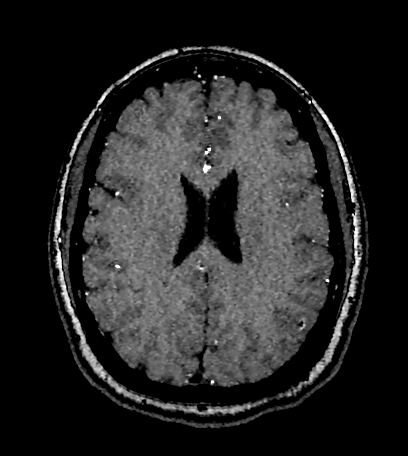
[im 184/217]
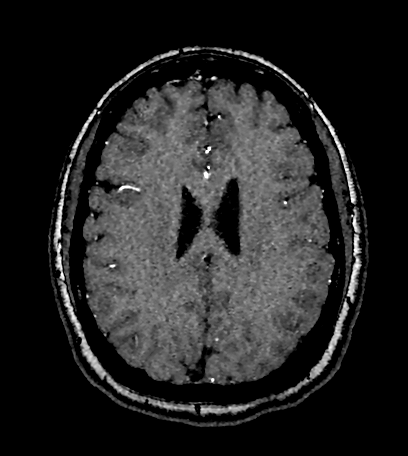
[im 207/217]
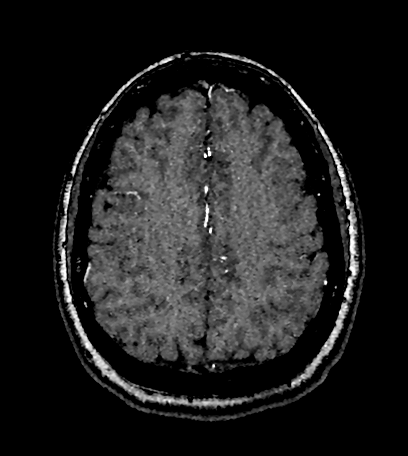

[26 of 48 positions shown; findings below may reference images not displayed]

FINDINGS: MRI HEAD FINDINGS

Brain: No acute infarct, mass effect or extra-axial collection. No
acute or chronic hemorrhage. Minimal multifocal hyperintense
T2-weight signal within the white matter. The midline structures are
normal.

Vascular: Major flow voids are preserved.

Skull and upper cervical spine: Normal calvarium and skull base.
Visualized upper cervical spine and soft tissues are normal.

Sinuses/Orbits:No paranasal sinus fluid levels or advanced mucosal
thickening. No mastoid or middle ear effusion. Normal orbits.

MRA HEAD FINDINGS

POSTERIOR CIRCULATION:

--Vertebral arteries: Normal

--Inferior cerebellar arteries: Normal.

--Basilar artery: Normal.

--Superior cerebellar arteries: Normal.

--Posterior cerebral arteries: Normal.

ANTERIOR CIRCULATION:

--Intracranial internal carotid arteries: Normal.

--Anterior cerebral arteries (ACA): Normal.

--Middle cerebral arteries (MCA): Normal.

ANATOMIC VARIANTS: None

MRA NECK FINDINGS

Aortic arch: Normal

Right carotid system: Normal without stenosis

Left carotid system: Normal without stenosis

Vertebral arteries: Codominant and normal

Other: None
IMPRESSION: 1. No acute intracranial abnormality.
2. Normal MRA of the head and neck.

## 2021-10-04 MED ORDER — SODIUM CHLORIDE 0.9% FLUSH: 3.0000 mL | Freq: Once | INTRAVENOUS | Status: DC

## 2021-10-04 MED ORDER — KETOROLAC TROMETHAMINE 15 MG/ML IJ SOLN
15.0000 mg | Freq: Once | INTRAMUSCULAR | Status: AC
Start: 2021-10-04 — End: 2021-10-04
  Administered 2021-10-04: 15 mg via INTRAVENOUS
  Filled 2021-10-04: qty 1

## 2021-10-04 MED ORDER — METOCLOPRAMIDE HCL 5 MG/ML IJ SOLN
10.0000 mg | Freq: Once | INTRAMUSCULAR | Status: AC
  Administered 2021-10-04: 10 mg via INTRAVENOUS
  Filled 2021-10-04: qty 2

## 2021-10-04 MED ORDER — GADOBUTROL 1 MMOL/ML IV SOLN
7.0000 mL | Freq: Once | INTRAVENOUS | Status: AC | PRN
  Administered 2021-10-04: 7 mL via INTRAVENOUS

## 2021-10-04 NOTE — ED Notes (Signed)
Patient transported to MRI 

## 2021-10-04 NOTE — ED Notes (Signed)
Pt returned from MRI °

## 2021-10-04 NOTE — ED Triage Notes (Signed)
Pt comes with c/o slurred speech, loss of balance and headache. Pt states this started at 11am. Pt states it has now gotten worse in last hour. ?

## 2021-10-04 NOTE — Discharge Instructions (Signed)
Your MRI of your brain and MR a looking at the vessels in your head and neck were all normal.  This is more likely to be a complicated migraine given your similar history.  However if you develop any new neurologic symptoms please return to the emergency department.  Please follow-up with your neurologist as they may recommend further work-up to look at your heart and to have you on a heart monitor to rule out other causes of TIA. ?

## 2021-10-05 NOTE — ED Notes (Signed)
Pt discharge information reviewed. Pt understands need for follow up care and when to return if symptoms worsen. All questions answered. Pt is alert and oriented with even and regular respirations. Pt is seen ambulating out of department with string steady gait.   

## 2021-10-05 NOTE — ED Provider Notes (Signed)
? ?[facility] ?Provider Note ? ? ? Event Date/Time  ? First MD Initiated Contact with Patient 10/04/21 1932   ?  (approximate) ? ? ?History  ? ?Aphasia ? ? ?HPI ? ?Jody Gonzalez is a 51 y.o. female with past medical history of type 2 diabetes, hypertension hyperlipidemia and migraines who presents with aphasia.  Patient noticed around 10:30 AM she started having slurred speech.  She works in medical intake and was having a difficult time typing and comprehending her work.  She also noticed she was having somewhat difficulty ambulating, seem to be swaying to her left side.  Denies any focal numbness tingling or weakness or visual change at that time.  Patient's husband did not notice her symptoms at first however it was her boss who was on the phone with her who recommended seek evaluation as he noticed her speech was slurred.  Patient had a similar episode in 2021 was admitted for TIA work-up ultimately had negative vessel imaging negative bubble study and no evidence of arrhythmia on telemetry.  It was thought that her speech difficulty was in the setting of complicated migraine.  Patient does endorse significant headache that is typical of her migraines and started around 11 AM today. ?  ? ?Past Medical History:  ?Diagnosis Date  ? Diabetes mellitus without complication (HCC)   ? Hyperlipidemia   ? Hypertension   ? Type 2 diabetes mellitus (HCC)   ? ? ?Patient Active Problem List  ? Diagnosis Date Noted  ? Complaint of headache   ? Word finding difficulty   ? DM type 2 (diabetes mellitus, type 2) (HCC) 08/25/2019  ? Migraine 08/25/2019  ? TIA (transient ischemic attack) 08/25/2019  ? Chest pain, moderate coronary artery risk 07/14/2019  ? Essential hypertension 07/14/2019  ? Hyperlipidemia LDL goal <70 07/14/2019  ? ? ? ?Physical Exam  ?Triage Vital Signs: ?ED Triage Vitals  ?Enc Vitals Group  ?   BP 10/04/21 1644 (!) 155/82  ?   Pulse Rate 10/04/21 1644 65  ?   Resp 10/04/21 1644 17  ?    Temp 10/04/21 1644 98.1 ?F (36.7 ?C)  ?   Temp Source 10/04/21 1644 Oral  ?   SpO2 10/04/21 1644 97 %  ?   Weight --   ?   Height --   ?   Head Circumference --   ?   Peak Flow --   ?   Pain Score 10/04/21 1638 10  ?   Pain Loc --   ?   Pain Edu? --   ?   Excl. in GC? --   ? ? ?Most recent vital signs: ?Vitals:  ? 10/04/21 2300 10/04/21 2330  ?BP: 124/80 132/82  ?Pulse: (!) 55 (!) 55  ?Resp: 13 13  ?Temp:    ?SpO2: 100% 100%  ? ? ? ?General: Awake, no distress.  ?CV:  Good peripheral perfusion.  ?Resp:  Normal effort.  ?Abd:  No distention.  ?Neuro:             Awake, Alert, Oriented x 3  ?Other:  Aox3, nml speech  ?PERRL, EOMI, face symmetric, nml tongue movement  ?5/5 strength in the BL upper and lower extremities  ?Sensation grossly intact in the BL upper and lower extremities  ?Finger-nose-finger intact BL ? ? ? ?ED Results / Procedures / Treatments  ?Labs ?(all labs ordered are listed, but only abnormal results are displayed) ?Labs Reviewed  ?COMPREHENSIVE METABOLIC PANEL - Abnormal; Notable  for the following components:  ?    Result Value  ? Glucose, Bld 264 (*)   ? All other components within normal limits  ?PROTIME-INR  ?APTT  ?CBC  ?DIFFERENTIAL  ?CBG MONITORING, ED  ?I-STAT CREATININE, ED  ? ? ? ?EKG ? ?EKG interpretation performed by myself: NSR, nml axis, nml intervals,inferior Q waves no acute ischemic changes ? ? ? ?RADIOLOGY ?I reviewed the CT scan of the brain which does not show any acute intracranial process; agree with radiology report  ? ? ? ?PROCEDURES: ? ?Critical Care performed: No ? ?.1-3 Lead EKG Interpretation ?Performed by: Georga Hacking, MD ?Authorized by: Georga Hacking, MD  ? ?  Interpretation: normal   ?  ECG rate assessment: normal   ?  Ectopy: none   ?  Conduction: normal   ? ?The patient is on the cardiac monitor to evaluate for evidence of arrhythmia and/or significant heart rate changes. ? ? ?MEDICATIONS ORDERED IN ED: ?Medications  ?sodium chloride flush (NS) 0.9 %  injection 3 mL (3 mLs Intravenous Not Given 10/04/21 1958)  ?ketorolac (TORADOL) 15 MG/ML injection 15 mg (15 mg Intravenous Given 10/04/21 2032)  ?metoCLOPramide (REGLAN) injection 10 mg (10 mg Intravenous Given 10/04/21 2035)  ?gadobutrol (GADAVIST) 1 MMOL/ML injection 7 mL (7 mLs Intravenous Contrast Given 10/04/21 2212)  ? ? ? ?IMPRESSION / MDM / ASSESSMENT AND PLAN / ED COURSE  ?I reviewed the triage vital signs and the nursing notes. ?             ?               ? ?Differential diagnosis includes, but is not limited to, TIA, complicated migraine ? ?Patient is a 51 year old female with cardiac risk factors as well as history of migraines with prior word finding difficulty in the setting of headache thought to be due to complicated migraine who presents with slurred speech.  This occurred around 10 AM today was accompanied with some difficulty ambulating as well as typical migraine headache.  Resolved while she was in the waiting room.  On exam she has no acute neurologic findings.  Vital signs are within normal limits.  CT head is negative.  Ultimately given her history of similar episode with negative TIA work-up and the thought that this was complicated migraine my suspicion for TIA is low however patient does have significant risk factors and do feel that she should have additional imaging so MRI brain and MRA head and neck were obtained which were negative.  Patient's EKG shows sinus rhythm.  Shared decision making was performed and patient was agreeable to discharge with neurology follow-up.  Think this is reasonable given her negative work-up and similar prior episode.  Did discuss immediate return to the ED for any new neurologic symptoms. ? ?  ? ? ?FINAL CLINICAL IMPRESSION(S) / ED DIAGNOSES  ? ?Final diagnoses:  ?Aphasia  ? ? ? ?Rx / DC Orders  ? ?ED Discharge Orders   ? ? None  ? ?  ? ? ? ?Note:  This document was prepared using Dragon voice recognition software and may include unintentional dictation  errors. ?  ?Georga Hacking, MD ?10/05/21 0036 ? ?

## 2022-02-06 ENCOUNTER — Ambulatory Visit: Admitting: Psychiatry

## 2022-03-09 LAB — EXTERNAL GENERIC LAB PROCEDURE: COLOGUARD: NEGATIVE

## 2022-03-30 ENCOUNTER — Encounter: Payer: Self-pay | Admitting: *Deleted

## 2022-04-03 ENCOUNTER — Ambulatory Visit (INDEPENDENT_AMBULATORY_CARE_PROVIDER_SITE_OTHER): Admitting: Psychiatry

## 2022-04-03 ENCOUNTER — Encounter: Payer: Self-pay | Admitting: Psychiatry

## 2022-04-03 VITALS — BP 102/74 | HR 73 | Ht 63.0 in | Wt 157.0 lb

## 2022-04-03 DIAGNOSIS — G43E19 Chronic migraine with aura, intractable, without status migrainosus: Secondary | ICD-10-CM

## 2022-04-03 MED ORDER — UBRELVY 100 MG PO TABS: 100.0000 mg | ORAL_TABLET | ORAL | 6 refills | Status: DC | PRN

## 2022-04-03 MED ORDER — QULIPTA 60 MG PO TABS: 60.0000 mg | ORAL_TABLET | Freq: Every day | ORAL | 6 refills | Status: DC

## 2022-04-03 NOTE — Patient Instructions (Signed)
Start Clymer daily for migraine prevention  Start Ubrelvy as needed for migraines. Take one pill at onset of migraine. May repeat a dose in 2 hours if headache persists. Max dose 2 pills in 24 hours

## 2022-04-03 NOTE — Progress Notes (Signed)
Referring:  Jody Gonzalez, [name], [facility] Dept of Psychiatry [place],  [place] [address]  PCP: Jene Every, MD  Neurology was asked to evaluate Jody Gonzalez, a 51 year old female for a chief complaint of headaches.  Our recommendations of care will be communicated by shared medical record.    CC:  headaches  History provided from self  HPI:  Medical co-morbidities: HTN, DM2, HLD  The patient presents for evaluation of headaches and slurred speech. She has had headaches since 2019 but they have been worsening over time. On 10/04/21 she developed slurred speech and presented to the ED. By the time she was in the ED she was not speaking but was gesturing. She could understand what people were saying to her but had trouble getting the words out. She also developed face numbness and flashing zig zags in her right peripheral vision. This was followed by her typical migraine headache. Brain MRI and MRA head/neck were unremarkable. A1c was 9.9. She had a TTE in 2021 which showed normal EF and was negative for shunt. She currently takes lipitor 80 mg daily for cholesterol management.  Had a similar episode in 2021 and TIA workup at the time was negative. This episode was suspected to be a migraine. Reports she did have a TIA back in 2008 when she was pregnant. These records are unavailable.  She currently has 4-5 headaches per week. Most of the time she has a headache without the aura. Headache is described as frontal throbbing with associated photophobia. It can last up to 24 hours at a time.   Headache History: Onset: 2019 Triggers: wakes up with them Aura: face numbness, slurred speech Location: frontal Quality/Description: throbbing Associated Symptoms:  Photophobia: yes  Phonophobia: no  Nausea: no Other symptoms: imbalance Worse with activity?: yes Duration of headaches: 24 hours  Headache days per month: 20 Headache free days per month:  10  Current Treatment: Abortive Excedrin BC powder  Preventative none  Prior Therapies                                 Nortriptyline 25 mg QHS Propranolol  Lisinopril - cough Telmisartan 20 mg daily Imitrex - chest pressure Excedrin BC powder  LABS: CBC    Component Value Date/Time   WBC 5.7 10/04/2021 1646   RBC 4.55 10/04/2021 1646   HGB 14.2 10/04/2021 1646   HCT 40.9 10/04/2021 1646   PLT 228 10/04/2021 1646   MCV 89.9 10/04/2021 1646   MCH 31.2 10/04/2021 1646   MCHC 34.7 10/04/2021 1646   RDW 12.8 10/04/2021 1646   LYMPHSABS 2.3 10/04/2021 1646   MONOABS 0.5 10/04/2021 1646   EOSABS 0.1 10/04/2021 1646   BASOSABS 0.1 10/04/2021 1646      Latest Ref Rng & Units 10/04/2021    4:46 PM 06/07/2021    4:24 PM 08/26/2019    5:54 AM  CMP  Glucose 70 - 99 mg/dL 264  196  226   BUN 6 - 20 mg/dL 12  13  8    Creatinine 0.44 - 1.00 mg/dL 0.58  0.46  0.58   Sodium 135 - 145 mmol/L 135  136  137   Potassium 3.5 - 5.1 mmol/L 3.8  3.3  3.8   Chloride 98 - 111 mmol/L 101  104  103   CO2 22 - 32 mmol/L 25  26  26  Calcium 8.9 - 10.3 mg/dL 9.6  9.0  8.3   Total Protein 6.5 - 8.1 g/dL 7.3   5.9   Total Bilirubin 0.3 - 1.2 mg/dL 0.7   1.0   Alkaline Phos 38 - 126 U/L 123   72   AST 15 - 41 U/L 22   19   ALT 0 - 44 U/L 37   35    10/24/21 A1c 9.9 08/2019 LDL 83  IMAGING:  10/04/21 MRI/MRA head/neck: unremarkable  Imaging independently reviewed on April 03, 2022   Current Outpatient Medications on File Prior to Visit  Medication Sig Dispense Refill   atorvastatin (LIPITOR) 10 MG tablet Take 10 mg by mouth daily.     empagliflozin (JARDIANCE) 25 MG TABS tablet Take by mouth daily.     insulin glargine (LANTUS) 100 UNIT/ML injection Inject 90 Units into the skin daily.     metFORMIN (GLUCOPHAGE) 1000 MG tablet Take 1 tablet (1,000 mg total) by mouth 2 (two) times daily with a meal. 60 tablet 11   metoprolol succinate (TOPROL-XL) 25 MG 24 hr tablet Take 25 mg by mouth  daily.     Multiple Vitamins-Minerals (HAIR SKIN AND NAILS FORMULA) TABS Take 1 tablet by mouth as directed.     nitroGLYCERIN (NITROSTAT) 0.4 MG SL tablet Place 1 tablet (0.4 mg total) under the tongue every 5 (five) minutes as needed for chest pain. Maximum of 3 doses. 90 tablet 0   telmisartan (MICARDIS) 40 MG tablet Take 0.5 tablets (20 mg total) by mouth daily. 30 tablet 2   No current facility-administered medications on file prior to visit.     Allergies: Allergies  Allergen Reactions   Azithromycin    Imitrex [Sumatriptan]    Lisinopril Cough    Family History: Migraine or other headaches in the family:  sister, mother, grandmother Aneurysms in a first degree relative:  no Brain tumors in the family:  no Other neurological illness in the family:   no  Past Medical History: Past Medical History:  Diagnosis Date   Arthritis    Diabetes mellitus without complication (HCC)    Hyperlipidemia    Hypertension    Type 2 diabetes mellitus (HCC)     Past Surgical History History reviewed. No pertinent surgical history.  Social History: Social History   Tobacco Use   Smoking status: Never   Smokeless tobacco: Never  Substance Use Topics   Alcohol use: Yes    Alcohol/week: 2.0 standard drinks of alcohol    Types: 2 Cans of beer per week   Drug use: Never    ROS: Negative for fevers, chills. Positive for headaches, numbness. All other systems reviewed and negative unless stated otherwise in HPI.   Physical Exam:   Vital Signs: BP 102/74   Pulse 73   Ht 5\' 3"  (1.6 m)   Wt 157 lb (71.2 kg)   BMI 27.81 kg/m  GENERAL: well appearing,in no acute distress,alert SKIN:  Color, texture, turgor normal. No rashes or lesions HEAD:  Normocephalic/atraumatic. CV:  RRR RESP: Normal respiratory effort MSK: no tenderness to palpation over occiput, neck, or shoulders  NEUROLOGICAL: Mental Status: Alert, oriented to person, place and time,Follows commands Cranial  Nerves: PERRL, visual fields intact to confrontation, extraocular movements intact, facial sensation intact, no facial droop or ptosis, hearing grossly intact, no dysarthria Motor: muscle strength 5/5 both upper and lower extremities,no drift, normal tone Reflexes: 2+ throughout Sensation: intact to light touch all 4 extremities Coordination: Finger-to- nose-finger intact  bilaterally Gait: normal-based   IMPRESSION: 51 year old female with a history of HTN, DM2, HLD who presents for evaluation of migraines and aphasia. Her symptoms are most consistent with migraine with aura. MRI/MRA in April 2023 were unremarkable. 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 Will start Qulipta for migraine prevention. Would avoid triptans given reported history of TIA in 2008. Will start Ubrelvy for migraine rescue.  PLAN: -Preventive: Start Qulipta 60 mg daily -Rescue: Start Ubrelvy 100 mg PRN -Next steps: consider Cymbalta, CGRP, Botox  I spent a total of 34 minutes chart reviewing and counseling the patient. Headache education was done. Discussed treatment options including preventive and acute medications. Discussed medication side effects, adverse reactions and drug interactions. Written educational materials and patient instructions outlining all of the above were given.  Follow-up: 3 months   Ocie Doyne, MD 04/03/2022   10:55 AM

## 2022-04-05 ENCOUNTER — Telehealth: Payer: Self-pay | Admitting: *Deleted

## 2022-04-05 NOTE — Telephone Encounter (Signed)
Lenoria Chime PA, Key: [id number], [id number]. faxed office notes to be attached to key.

## 2022-04-05 NOTE — Telephone Encounter (Signed)
Received e mail, documents are attached.  [employer] is reviewing your PA request and will respond within 24 hours for Medicaid or up to 72 hours for non-Medicaid plans

## 2022-04-06 ENCOUNTER — Encounter: Payer: Self-pay | Admitting: Psychiatry

## 2022-04-06 NOTE — Telephone Encounter (Signed)
Qulipta denied. Coverage is provided in situations where the requested medication is being used for prevention of episodic migraine. Coverage cannot be authorized at this time. Sent to MD.

## 2022-04-06 NOTE — Telephone Encounter (Signed)
I sent the patient a message to see if she wants to try cymbalta instead

## 2022-04-11 MED ORDER — DULOXETINE HCL 20 MG PO CPEP: 20.0000 mg | ORAL_CAPSULE | Freq: Every day | ORAL | 6 refills | Status: DC

## 2022-04-11 NOTE — Telephone Encounter (Signed)
Rx sent to her pharmacy 

## 2022-06-29 NOTE — Progress Notes (Deleted)
PCP: Barron Alvine, MD   CC:  headaches  History provided from self  Follow-up visit:  Prior visit: 04/03/2022 Dr. Delena Bali    Brief HPI:   Jody Gonzalez is a 51 y.o. female who is being followed by Dr. Delena Bali for chronic migraine headaches which started in 2019.  Episode in 10/2021 consisting of slurred speech, word finding difficulty, facial numbness and flashing zigzags and right peripheral vision prior to onset of typical migraine headache.  Workup largely unremarkable and felt likely in setting of migraine headache.  Similar episode in 2021 with stroke workup unremarkable suspected to be migraine.  Reports TIA in 2008 during pregnancy, records unavailable for review.  At prior visit, attempted to start on Qulipta as patient wished to avoid injections but was denied by insurance therefore started on duloxetine.  Interval history:    The patient presents for evaluation of headaches and slurred speech. She has had headaches since 2019 but they have been worsening over time. On 10/04/21 she developed slurred speech and presented to the ED. By the time she was in the ED she was not speaking but was gesturing. She could understand what people were saying to her but had trouble getting the words out. She also developed face numbness and flashing zig zags in her right peripheral vision. This was followed by her typical migraine headache. Brain MRI and MRA head/neck were unremarkable. A1c was 9.9. She had a TTE in 2021 which showed normal EF and was negative for shunt. She currently takes lipitor 80 mg daily for cholesterol management.  Had a similar episode in 2021 and TIA workup at the time was negative. This episode was suspected to be a migraine. Reports she did have a TIA back in 2008 when she was pregnant. These records are unavailable.  She currently has 4-5 headaches per week. Most of the time she has a headache without the aura. Headache is described as frontal throbbing with  associated photophobia. It can last up to 24 hours at a time.   Headache History: Onset: 2019 Triggers: wakes up with them Aura: face numbness, slurred speech Location: frontal Quality/Description: throbbing Associated Symptoms:  Photophobia: yes  Phonophobia: no  Nausea: no Other symptoms: imbalance Worse with activity?: yes Duration of headaches: 24 hours  Headache days per month: 20 Headache free days per month: 10  Current Treatment: Abortive Excedrin BC powder  Preventative Duloxetine  Prior Therapies                                 Nortriptyline 25 mg QHS Propranolol  Lisinopril - cough Telmisartan 20 mg daily Imitrex - chest pressure Excedrin BC powder  LABS: CBC    Component Value Date/Time   WBC 5.7 10/04/2021 1646   RBC 4.55 10/04/2021 1646   HGB 14.2 10/04/2021 1646   HCT 40.9 10/04/2021 1646   PLT 228 10/04/2021 1646   MCV 89.9 10/04/2021 1646   MCH 31.2 10/04/2021 1646   MCHC 34.7 10/04/2021 1646   RDW 12.8 10/04/2021 1646   LYMPHSABS 2.3 10/04/2021 1646   MONOABS 0.5 10/04/2021 1646   EOSABS 0.1 10/04/2021 1646   BASOSABS 0.1 10/04/2021 1646      Latest Ref Rng & Units 10/04/2021    4:46 PM 06/07/2021    4:24 PM 08/26/2019    5:54 AM  CMP  Glucose 70 - 99 mg/dL 629  528  413   BUN  6 - 20 mg/dL 12  13  8    Creatinine 0.44 - 1.00 mg/dL  2.62  0.35   Sodium 135 - 145 mmol/L 135  136  137   Potassium 3.5 - 5.1 mmol/L 3.8  3.3  3.8   Chloride 98 - 111 mmol/L 101  104  103   CO2 22 - 32 mmol/L 25  26  26    Calcium 8.9 - 10.3 mg/dL 9.6  9.0  8.3   Total Protein 6.5 - 8.1 g/dL 7.3   5.9   Total Bilirubin 0.3 - 1.2 mg/dL 0.7   1.0   Alkaline Phos 38 - 126 U/L 123   72   AST 15 - 41 U/L 22   19   ALT 0 - 44 U/L 37   35    10/24/21 A1c 9.9 08/2019 LDL 83  IMAGING:  10/04/21 MRI/MRA head/neck: unremarkable  Imaging independently reviewed on June 29, 2022   Current Outpatient Medications on File Prior to Visit  Medication Sig  Dispense Refill   Atogepant (QULIPTA) 60 MG TABS Take 60 mg by mouth daily. 30 tablet 6   atorvastatin (LIPITOR) 10 MG tablet Take 10 mg by mouth daily.     DULoxetine (CYMBALTA) 20 MG capsule Take 1 capsule (20 mg total) by mouth daily. 30 capsule 6   empagliflozin (JARDIANCE) 25 MG TABS tablet Take by mouth daily.     insulin glargine (LANTUS) 100 UNIT/ML injection Inject 90 Units into the skin daily.     metFORMIN (GLUCOPHAGE) 1000 MG tablet Take 1 tablet (1,000 mg total) by mouth 2 (two) times daily with a meal. 60 tablet 11   metoprolol succinate (TOPROL-XL) 25 MG 24 hr tablet Take 25 mg by mouth daily.     Multiple Vitamins-Minerals (HAIR SKIN AND NAILS FORMULA) TABS Take 1 tablet by mouth as directed.     nitroGLYCERIN (NITROSTAT) 0.4 MG SL tablet Place 1 tablet (0.4 mg total) under the tongue every 5 (five) minutes as needed for chest pain. Maximum of 3 doses. 90 tablet 0   telmisartan (MICARDIS) 40 MG tablet Take 0.5 tablets (20 mg total) by mouth daily. 30 tablet 2   Ubrogepant (UBRELVY) 100 MG TABS Take 100 mg by mouth as needed (for migraine). May repeat a dose in 2 hours if needed. Max dose 2 pills in 24 hours 16 tablet 6   No current facility-administered medications on file prior to visit.     Allergies: Allergies  Allergen Reactions   Azithromycin    Imitrex [Sumatriptan]    Lisinopril Cough    Family History: Migraine or other headaches in the family:  sister, mother, grandmother Aneurysms in a first degree relative:  no Brain tumors in the family:  no Other neurological illness in the family:   no  Past Medical History: Past Medical History:  Diagnosis Date   Arthritis    Diabetes mellitus without complication (HCC)    Hyperlipidemia    Hypertension    Type 2 diabetes mellitus (HCC)     Past Surgical History No past surgical history on file.  Social History: Social History   Tobacco Use   Smoking status: Never   Smokeless tobacco: Never  Substance  Use Topics   Alcohol use: Yes    Alcohol/week: 2.0 standard drinks of alcohol    Types: 2 Cans of beer per week   Drug use: Never    ROS: Negative for fevers, chills. Positive for headaches, numbness. All other systems  reviewed and negative unless stated otherwise in HPI.   Physical Exam:   Vital Signs: There were no vitals taken for this visit. GENERAL: well appearing,in no acute distress,alert SKIN:  Color, texture, turgor normal. No rashes or lesions HEAD:  Normocephalic/atraumatic. CV:  RRR RESP: Normal respiratory effort MSK: no tenderness to palpation over occiput, neck, or shoulders  NEUROLOGICAL: Mental Status: Alert, oriented to person, place and time,Follows commands Cranial Nerves: PERRL, visual fields intact to confrontation, extraocular movements intact, facial sensation intact, no facial droop or ptosis, hearing grossly intact, no dysarthria Motor: muscle strength 5/5 both upper and lower extremities,no drift, normal tone Reflexes: 2+ throughout Sensation: intact to light touch all 4 extremities Coordination: Finger-to- nose-finger intact bilaterally Gait: normal-based   IMPRESSION: 51 year old female with a history of HTN, DM2, HLD who presents for evaluation of migraines and aphasia.  Initial consult visit with Dr. Delena Bali on 04/03/2022.  MRI/MRA in April 2023 were unremarkable. 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   Will start Qulipta for migraine prevention. Would avoid triptans given reported history of TIA in 2008. Will start Ubrelvy for migraine rescue.  PLAN: -Preventive: Start Qulipta 60 mg daily -Rescue: Start Ubrelvy 100 mg PRN -Next steps: consider Cymbalta, CGRP, Botox    I spent *** minutes of face-to-face and non-face-to-face time with patient.  This included previsit chart review, lab review, study  review, order entry, electronic health record documentation, patient education  Ihor Austin, [facility]  [facility] [address] [place], [place] [address]  Phone [phone] Fax [phone] Note: This document was prepared with digital dictation and possible smart phrase technology. Any transcriptional errors that result from this process are unintentional.

## 2022-07-04 ENCOUNTER — Other Ambulatory Visit: Payer: Self-pay

## 2022-07-04 ENCOUNTER — Emergency Department

## 2022-07-04 ENCOUNTER — Observation Stay

## 2022-07-04 ENCOUNTER — Ambulatory Visit: Admitting: Adult Health

## 2022-07-04 ENCOUNTER — Observation Stay
Admission: EM | Admit: 2022-07-04 | Discharge: 2022-07-05 | Disposition: A | Discharge status: Home / Self Care | Attending: Student | Admitting: Student

## 2022-07-04 ENCOUNTER — Telehealth: Payer: Self-pay | Admitting: Psychiatry

## 2022-07-04 DIAGNOSIS — R29810 Facial weakness: Secondary | ICD-10-CM

## 2022-07-04 DIAGNOSIS — Z794 Long term (current) use of insulin: Secondary | ICD-10-CM | POA: Diagnosis not present

## 2022-07-04 DIAGNOSIS — R29818 Other symptoms and signs involving the nervous system: Principal | ICD-10-CM | POA: Insufficient documentation

## 2022-07-04 DIAGNOSIS — R2990 Unspecified symptoms and signs involving the nervous system: Secondary | ICD-10-CM

## 2022-07-04 DIAGNOSIS — R202 Paresthesia of skin: Secondary | ICD-10-CM

## 2022-07-04 DIAGNOSIS — E1169 Type 2 diabetes mellitus with other specified complication: Secondary | ICD-10-CM | POA: Diagnosis not present

## 2022-07-04 DIAGNOSIS — Z7984 Long term (current) use of oral hypoglycemic drugs: Secondary | ICD-10-CM | POA: Insufficient documentation

## 2022-07-04 DIAGNOSIS — I10 Essential (primary) hypertension: Secondary | ICD-10-CM | POA: Diagnosis not present

## 2022-07-04 DIAGNOSIS — Z8673 Personal history of transient ischemic attack (TIA), and cerebral infarction without residual deficits: Secondary | ICD-10-CM | POA: Insufficient documentation

## 2022-07-04 DIAGNOSIS — G43009 Migraine without aura, not intractable, without status migrainosus: Secondary | ICD-10-CM

## 2022-07-04 DIAGNOSIS — R4789 Other speech disturbances: Secondary | ICD-10-CM

## 2022-07-04 DIAGNOSIS — R4781 Slurred speech: Secondary | ICD-10-CM | POA: Diagnosis not present

## 2022-07-04 DIAGNOSIS — I639 Cerebral infarction, unspecified: Secondary | ICD-10-CM | POA: Diagnosis present

## 2022-07-04 DIAGNOSIS — Z79899 Other long term (current) drug therapy: Secondary | ICD-10-CM | POA: Insufficient documentation

## 2022-07-04 DIAGNOSIS — E119 Type 2 diabetes mellitus without complications: Secondary | ICD-10-CM

## 2022-07-04 DIAGNOSIS — R200 Anesthesia of skin: Secondary | ICD-10-CM | POA: Diagnosis not present

## 2022-07-04 DIAGNOSIS — E785 Hyperlipidemia, unspecified: Secondary | ICD-10-CM | POA: Diagnosis present

## 2022-07-04 LAB — COMPREHENSIVE METABOLIC PANEL
ALT: 34 U/L (ref 0–44)
AST: 24 U/L (ref 15–41)
Albumin: 4.8 g/dL (ref 3.5–5.0)
Alkaline Phosphatase: 104 U/L (ref 38–126)
Anion gap: 9 (ref 5–15)
BUN: 9 mg/dL (ref 6–20)
CO2: 24 mmol/L (ref 22–32)
Calcium: 9.4 mg/dL (ref 8.9–10.3)
Chloride: 105 mmol/L (ref 98–111)
Creatinine, Ser: 0.52 mg/dL (ref 0.44–1.00)
GFR, Estimated: >60 mL/min (ref >60)
Glucose, Bld: 141 mg/dL — ABNORMAL HIGH (ref 70–99)
Potassium: 3.6 mmol/L (ref 3.5–5.1)
Sodium: 138 mmol/L (ref 135–145)
Total Bilirubin: 0.9 mg/dL (ref 0.3–1.2)
Total Protein: 7.4 g/dL (ref 6.5–8.1)

## 2022-07-04 LAB — DIFFERENTIAL
Abs Immature Granulocytes: 0.03 10*3/uL (ref 0.00–0.07)
Basophils Absolute: 0 10*3/uL (ref 0.0–0.1)
Basophils Relative: 0 %
Eosinophils Absolute: 0.1 10*3/uL (ref 0.0–0.5)
Eosinophils Relative: 2 %
Immature Granulocytes: 0 %
Lymphocytes Relative: 34 %
Lymphs Abs: 2.3 10*3/uL (ref 0.7–4.0)
Monocytes Absolute: 0.5 10*3/uL (ref 0.1–1.0)
Monocytes Relative: 8 %
Neutro Abs: 3.8 10*3/uL (ref 1.7–7.7)
Neutrophils Relative %: 56 %

## 2022-07-04 LAB — CBC
HCT: 42.2 % (ref 36.0–46.0)
HCT: 38.9 % (ref 36.0–46.0)
Hemoglobin: 14.4 g/dL (ref 12.0–15.0)
Hemoglobin: 13.5 g/dL (ref 12.0–15.0)
MCH: 31.2 pg (ref 26.0–34.0)
MCH: 31.8 pg (ref 26.0–34.0)
MCHC: 34.1 g/dL (ref 30.0–36.0)
MCHC: 34.7 g/dL (ref 30.0–36.0)
MCV: 91.5 fL (ref 80.0–100.0)
MCV: 91.7 fL (ref 80.0–100.0)
Platelets: 212 10*3/uL (ref 150–400)
Platelets: 199 10*3/uL (ref 150–400)
RBC: 4.61 MIL/uL (ref 3.87–5.11)
RBC: 4.24 MIL/uL (ref 3.87–5.11)
RDW: 12.7 % (ref 11.5–15.5)
RDW: 12.7 % (ref 11.5–15.5)
WBC: 6.8 10*3/uL (ref 4.0–10.5)
WBC: 6.7 10*3/uL (ref 4.0–10.5)
nRBC: 0 % (ref 0.0–0.2)
nRBC: 0 % (ref 0.0–0.2)

## 2022-07-04 LAB — ETHANOL: Alcohol, Ethyl (B): <10 mg/dL (ref <10)

## 2022-07-04 LAB — PROTIME-INR
INR: 1 (ref 0.8–1.2)
Prothrombin Time: 13.1 seconds (ref 11.4–15.2)

## 2022-07-04 LAB — HEMOGLOBIN A1C
Hgb A1c MFr Bld: 7.9 % — ABNORMAL HIGH (ref 4.8–5.6)
Mean Plasma Glucose: 180 mg/dL

## 2022-07-04 LAB — CBG MONITORING, ED
Glucose-Capillary: 142 mg/dL — ABNORMAL HIGH (ref 70–99)
Glucose-Capillary: 136 mg/dL — ABNORMAL HIGH (ref 70–99)
Glucose-Capillary: 163 mg/dL — ABNORMAL HIGH (ref 70–99)

## 2022-07-04 LAB — CREATININE, SERUM
Creatinine, Ser: 0.54 mg/dL (ref 0.44–1.00)
GFR, Estimated: >60 mL/min (ref >60)

## 2022-07-04 LAB — APTT: aPTT: 24 seconds (ref 24–36)

## 2022-07-04 MED ORDER — NITROGLYCERIN 0.4 MG SL SUBL: 0.4000 mg | SUBLINGUAL_TABLET | SUBLINGUAL | Status: DC | PRN

## 2022-07-04 MED ORDER — SENNOSIDES-DOCUSATE SODIUM 8.6-50 MG PO TABS: 1.0000 | ORAL_TABLET | Freq: Every evening | ORAL | Status: DC | PRN

## 2022-07-04 MED ORDER — SODIUM CHLORIDE 0.9% FLUSH
3.0000 mL | Freq: Once | INTRAVENOUS | Status: AC
  Administered 2022-07-04: 3 mL via INTRAVENOUS

## 2022-07-04 MED ORDER — STROKE: EARLY STAGES OF RECOVERY BOOK: Freq: Once | Status: AC

## 2022-07-04 MED ORDER — ACETAMINOPHEN 325 MG PO TABS
650.0000 mg | ORAL_TABLET | ORAL | Status: DC | PRN
  Administered 2022-07-04: 650 mg via ORAL

## 2022-07-04 MED ORDER — ASPIRIN 325 MG PO TABS
325.0000 mg | ORAL_TABLET | Freq: Every day | ORAL | Status: DC
  Administered 2022-07-04: 325 mg via ORAL
  Filled 2022-07-04: qty 1

## 2022-07-04 MED ORDER — ENOXAPARIN SODIUM 40 MG/0.4ML IJ SOSY
40.0000 mg | PREFILLED_SYRINGE | Freq: Every day | INTRAMUSCULAR | Status: DC
  Administered 2022-07-04: 40 mg via SUBCUTANEOUS
  Filled 2022-07-04: qty 0.4

## 2022-07-04 MED ORDER — IOHEXOL 350 MG/ML SOLN
50.0000 mL | Freq: Once | INTRAVENOUS | Status: AC | PRN
  Administered 2022-07-04: 50 mL via INTRAVENOUS

## 2022-07-04 MED ORDER — UBROGEPANT 100 MG PO TABS: 100.0000 mg | ORAL_TABLET | ORAL | Status: DC | PRN

## 2022-07-04 MED ORDER — ASPIRIN 81 MG PO TBEC
81.0000 mg | DELAYED_RELEASE_TABLET | Freq: Every day | ORAL | Status: DC
  Administered 2022-07-05: 81 mg via ORAL
  Filled 2022-07-04: qty 1

## 2022-07-04 MED ORDER — CLOPIDOGREL BISULFATE 75 MG PO TABS
75.0000 mg | ORAL_TABLET | Freq: Every day | ORAL | Status: DC
  Administered 2022-07-04 – 2022-07-05 (×2): 75 mg via ORAL
  Filled 2022-07-04 (×2): qty 1

## 2022-07-04 MED ORDER — ACETAMINOPHEN 650 MG RE SUPP: 650.0000 mg | RECTAL | Status: DC | PRN

## 2022-07-04 MED ORDER — METOPROLOL SUCCINATE ER 25 MG PO TB24
25.0000 mg | ORAL_TABLET | Freq: Every day | ORAL | Status: DC
  Filled 2022-07-04: qty 1

## 2022-07-04 MED ORDER — IRBESARTAN 150 MG PO TABS: 75.0000 mg | ORAL_TABLET | Freq: Every day | ORAL | Status: DC

## 2022-07-04 MED ORDER — INSULIN GLARGINE-YFGN 100 UNIT/ML ~~LOC~~ SOLN
30.0000 [IU] | Freq: Every day | SUBCUTANEOUS | Status: DC
  Administered 2022-07-05: 30 [IU] via SUBCUTANEOUS
  Filled 2022-07-04: qty 0.3

## 2022-07-04 MED ORDER — INSULIN ASPART 100 UNIT/ML IJ SOLN
0.0000 [IU] | Freq: Three times a day (TID) | INTRAMUSCULAR | Status: DC
  Administered 2022-07-04: 1 [IU] via SUBCUTANEOUS
  Administered 2022-07-05: 2 [IU] via SUBCUTANEOUS
  Administered 2022-07-05: 1 [IU] via SUBCUTANEOUS
  Filled 2022-07-04 (×3): qty 1

## 2022-07-04 MED ORDER — INSULIN ASPART 100 UNIT/ML IJ SOLN: 0.0000 [IU] | Freq: Every day | INTRAMUSCULAR | Status: DC

## 2022-07-04 MED ORDER — ACETAMINOPHEN 160 MG/5ML PO SOLN: 650.0000 mg | ORAL | Status: DC | PRN

## 2022-07-04 MED ORDER — STROKE: EARLY STAGES OF RECOVERY BOOK: Freq: Once | Status: DC

## 2022-07-04 MED ORDER — ATORVASTATIN CALCIUM 20 MG PO TABS
10.0000 mg | ORAL_TABLET | Freq: Every day | ORAL | Status: DC
  Administered 2022-07-04: 10 mg via ORAL
  Filled 2022-07-04: qty 1

## 2022-07-04 MED ORDER — ATOGEPANT 60 MG PO TABS: 60.0000 mg | ORAL_TABLET | Freq: Every day | ORAL | Status: DC

## 2022-07-04 MED ORDER — INSULIN ASPART 100 UNIT/ML IJ SOLN
3.0000 [IU] | Freq: Three times a day (TID) | INTRAMUSCULAR | Status: DC
  Administered 2022-07-04 – 2022-07-05 (×3): 3 [IU] via SUBCUTANEOUS
  Filled 2022-07-04 (×2): qty 1

## 2022-07-04 NOTE — Code Documentation (Addendum)
Stroke Response Nurse Documentation Code Documentation  Psalms Olarte is a 52 y.o. female arriving to [facility] via [place] on 07/04/22 with past medical hx of DM, HTN, HLD, arthritis. On No antithrombotic. Code stroke was activated by ED.   Patient from home where she was LKW at 1045 when she of acute onset word finding difficulty and left sided facial numbness.    Stroke team met patient in CT after code stroke activation.  Patient to CT with team. NIHSS 2, see documentation for details and code stroke times. Patient with left facial droop and left decreased sensation on exam. The following imaging was completed:  CT Head. Patient is not a candidate for IV Thrombolytic due to too mild to treat, per MD. Patient is not a candidate for IR due to LVO not suspected per MD   Care Plan: q30 minute NIHSS/vital signs until outside window (1515) and then q2h, swallow screen per order.   Bedside handoff with ED RN Erlene Quan.    Charise Carwin  Stroke Response RN

## 2022-07-04 NOTE — ED Provider Notes (Signed)
[facility] Provider Note    Event Date/Time   First MD Initiated Contact with Patient 07/04/22 1221     (approximate)   History   Chief Complaint Code Stroke   HPI  Jody Gonzalez is a 52 y.o. female with past medical history of hypertension, hyperlipidemia, diabetes, TIA, and migraines who presents to the ED for code stroke.  Patient reports that she began "feeling off" around 9:00 this morning as she was headed into work.  Around 10:45, she then began to notice numbness over her left face with slurred speech and some difficulty finding her words.  She denies any numbness or weakness in her extremities, has not noticed any facial droop.  Code stroke activated on arrival to the ED.  Patient reports that she has had similar episodes in the past and been diagnosed with TIA.     Physical Exam   Triage Vital Signs: ED Triage Vitals  Enc Vitals Group     BP 07/04/22 1157 136/86     Pulse Rate 07/04/22 1157 75     Resp 07/04/22 1157 18     Temp 07/04/22 1205 98 F (36.7 C)     Temp Source 07/04/22 1205 Oral     SpO2 07/04/22 1157 98 %     Weight 07/04/22 1206 156 lb 8.4 oz (71 kg)     Height 07/04/22 1206 5\' 3"  (1.6 m)     Head Circumference --      Peak Flow --      Pain Score 07/04/22 1205 0     Pain Loc --      Pain Edu? --      Excl. in [place]? --     Most recent vital signs: Vitals:   07/04/22 1330 07/04/22 1400  BP: 131/88 126/86  Pulse: 63 72  Resp: 10 17  Temp:    SpO2: 100% 99%    Constitutional: Alert and oriented. Eyes: Conjunctivae are normal. Head: Atraumatic. Nose: No congestion/rhinnorhea. Mouth/Throat: Mucous membranes are moist.  Cardiovascular: Normal rate, regular rhythm. Grossly normal heart sounds.  2+ radial pulses bilaterally. Respiratory: Normal respiratory effort.  No retractions. Lungs CTAB. Gastrointestinal: Soft and nontender. No distention. Musculoskeletal: No lower extremity tenderness nor edema.  Neurologic:  Slowed speech pattern noted with no slurring.  Decree sensation over left face with no facial droop noted, strength 5 out of 5 in bilateral upper and lower extremities.    ED Results / Procedures / Treatments   Labs (all labs ordered are listed, but only abnormal results are displayed) Labs Reviewed  COMPREHENSIVE METABOLIC PANEL - Abnormal; Notable for the following components:      Result Value   Glucose, Bld 141 (*)    All other components within normal limits  CBG MONITORING, ED - Abnormal; Notable for the following components:   Glucose-Capillary 142 (*)    All other components within normal limits  PROTIME-INR  APTT  CBC  DIFFERENTIAL  ETHANOL  I-STAT CREATININE, ED  CBG MONITORING, ED  POC URINE PREG, ED     EKG  ED ECG REPORT I, Blake Divine, the attending physician, personally viewed and interpreted this ECG.   Date: 07/04/2022  EKG Time: 12:16  Rate: 72  Rhythm: normal sinus rhythm  Axis: Normal  Intervals:none  ST&T Change: None  RADIOLOGY CT head reviewed and interpreted by me with no hemorrhage or midline shift.  PROCEDURES:  Critical Care performed: No  Procedures   MEDICATIONS ORDERED IN ED:  Medications  aspirin tablet 325 mg (325 mg Oral Given 07/04/22 1317)  clopidogrel (PLAVIX) tablet 75 mg (75 mg Oral Given 07/04/22 1317)  sodium chloride flush (NS) 0.9 % injection 3 mL (3 mLs Intravenous Given 07/04/22 1220)     IMPRESSION / MDM / ASSESSMENT AND PLAN / ED COURSE  I reviewed the triage vital signs and the nursing notes.                              52 y.o. female with past medical history of hypertension, hyperlipidemia, diabetes, TIA, and migraines who presents to the ED complaining of acute onset word finding difficulties, slurred speech, and left facial numbness around 1045 this morning.  Patient's presentation is most consistent with acute presentation with potential threat to life or bodily function.  Differential diagnosis  includes, but is not limited to, stroke, TIA, complicated migraine, intracranial hemorrhage, electrolyte abnormality, anemia, hypoglycemia.  Patient well-appearing and in no acute distress, vital signs are unremarkable.  She has decreased sensation over left face but no other neurologic deficits are appreciated at this time, does have slowed speech pattern but seems to be finding words well.  Code stroke activated and CT head negative for acute process, symptoms to mild to treat per Dr. Quinn Axe of neurology but we will observe here in the ED while she remains in the window for TNK.  Dr. Quinn Axe also recommends further assessment with EEG given similar episodes of symptoms in the past.  Patient's neurologic symptoms are improving and she does complain of some ongoing headache.  She was reevaluated by Dr. Quinn Axe and with no worsening neurologic symptoms, neurology continues to feel that presentation does not warrant TNK.  Dr. Quinn Axe does recommend admission to possible service for further TIA workup in addition to EEG.  Labs are reassuring with no significant anemia, leukocytosis, lecture abnormality, or AKI.  LFTs and coags are also unremarkable.  Case discussed with hospitalist for admission.      FINAL CLINICAL IMPRESSION(S) / ED DIAGNOSES   Final diagnoses:  Left facial numbness  Slurred speech     Rx / DC Orders   ED Discharge Orders     None        Note:  This document was prepared using Dragon voice recognition software and may include unintentional dictation errors.   Blake Divine, MD 07/04/22 [phone]

## 2022-07-04 NOTE — Consult Note (Addendum)
NEUROLOGY CONSULTATION NOTE   Date of service: July 04, 2022 Patient Name: Jody Gonzalez MRN:  [id number] DOB:  05-09-1971 Reason for consult: stroke code Requesting physician: "Dr. Blake Divine" _ _ _   _ __   _ __ _ _  __ __   _ __   __ _  History of Present Illness   This is a 52 year old woman with past medical history significant for diabetes, hyperlipidemia, hypertension who presents with acute onset of left-sided facial numbness and left facial droop at 10:45 AM.  NIH stroke scale was 2 for sensory and facial droop.  CT head showed no acute intracranial process on personal review.  TNK was not administered due to symptoms being too mild to treat.  CTA was not performed due to exam not consistent with LVO.  She does not have a headache at that time although she does have a history of frequent migraines, some of which have been in the past have been associated with facial numbness.  She has developed a slight headache since that time although it is not her typical migraine.  She sees Dr. Billey Gosling at [facility] for migraines.  She has been diagnosed with TIAs in the past after similar episodes and has multiple risk factors for stroke.  She has not had a full TIA workup since 2021.   ROS   Per HPI: all other systems reviewed and are negative  Past History   I have reviewed the following:  Past Medical History:  Diagnosis Date   Arthritis    Diabetes mellitus without complication ([place])    Hyperlipidemia    Hypertension    Type 2 diabetes mellitus ([place])    No past surgical history on file. Family History  Problem Relation Age of Onset   Diabetes Mother    Heart attack Mother 78   Hyperlipidemia Father    Hypertension Father    Heart attack Sister 66   Social History   Socioeconomic History   Marital status: Married    Spouse name: Not on file   Number of children: Not on file   Years of education: Not on file   Highest education level: Not on file  Occupational History   Not  on file  Tobacco Use   Smoking status: Never   Smokeless tobacco: Never  Substance and Sexual Activity   Alcohol use: Yes    Alcohol/week: 2.0 standard drinks of alcohol    Types: 2 Cans of beer per week   Drug use: Never   Sexual activity: Not on file    Comment: ablation  Other Topics Concern   Not on file  Social History Narrative   Not on file   Social Determinants of Health   Financial Resource Strain: Not on file  Food Insecurity: Not on file  Transportation Needs: Not on file  Physical Activity: Not on file  Stress: Not on file  Social Connections: Not on file   Allergies  Allergen Reactions   Azithromycin    Imitrex [Sumatriptan]    Lisinopril Cough    Medications   (Not in a hospital admission)     Current Facility-Administered Medications:    aspirin tablet 325 mg, 325 mg, Oral, Daily, Derek Jack, MD   clopidogrel (PLAVIX) tablet 75 mg, 75 mg, Oral, Daily, Derek Jack, MD  Current Outpatient Medications:    Atogepant (QULIPTA) 60 MG TABS, Take 60 mg by mouth daily., Disp: 30 tablet, Rfl: 6   atorvastatin (LIPITOR) 10  MG tablet, Take 10 mg by mouth daily., Disp: , Rfl:    DULoxetine (CYMBALTA) 20 MG capsule, Take 1 capsule (20 mg total) by mouth daily., Disp: 30 capsule, Rfl: 6   empagliflozin (JARDIANCE) 25 MG TABS tablet, Take by mouth daily., Disp: , Rfl:    insulin glargine (LANTUS) 100 UNIT/ML injection, Inject 90 Units into the skin daily., Disp: , Rfl:    metFORMIN (GLUCOPHAGE) 1000 MG tablet, Take 1 tablet (1,000 mg total) by mouth 2 (two) times daily with a meal., Disp: 60 tablet, Rfl: 11   metoprolol succinate (TOPROL-XL) 25 MG 24 hr tablet, Take 25 mg by mouth daily., Disp: , Rfl:    Multiple Vitamins-Minerals (HAIR SKIN AND NAILS FORMULA) TABS, Take 1 tablet by mouth as directed., Disp: , Rfl:    nitroGLYCERIN (NITROSTAT) 0.4 MG SL tablet, Place 1 tablet (0.4 mg total) under the tongue every 5 (five) minutes as needed for chest pain.  Maximum of 3 doses., Disp: 90 tablet, Rfl: 0   telmisartan (MICARDIS) 40 MG tablet, Take 0.5 tablets (20 mg total) by mouth daily., Disp: 30 tablet, Rfl: 2   Ubrogepant (UBRELVY) 100 MG TABS, Take 100 mg by mouth as needed (for migraine). May repeat a dose in 2 hours if needed. Max dose 2 pills in 24 hours, Disp: 16 tablet, Rfl: 6  Vitals   Vitals:   07/04/22 1205 07/04/22 1206 07/04/22 1215 07/04/22 1230  BP:   (!) 144/94 138/88  Pulse: 75  66 69  Resp: 18  11 15   Temp: 98 F (36.7 C)     TempSrc: Oral     SpO2:   100% 100%  Weight:  71 kg    Height:  5\' 3"  (1.6 m)       Body mass index is 27.73 kg/m.  Physical Exam   Physical Exam Gen: A&O x4, NAD HEENT: Atraumatic, normocephalic;mucous membranes moist; oropharynx clear, tongue without atrophy or fasciculations. Neck: Supple, trachea midline. Resp: CTAB, no w/r/r CV: RRR, no m/g/r; nml S1 and S2. 2+ symmetric peripheral pulses. Abd: soft/NT/ND; nabs x 4 quad Extrem: Nml bulk; no cyanosis, clubbing, or edema.  Neuro: *MS: A&O x4. Follows multi-step commands.  *Speech: fluid, slowed speech, nondysarthric, able to name and repeat *CN:    I: Deferred   II,III: PERRLA, VFF by confrontation, optic discs unable to be visualized 2/2 pupillary constriction   III,IV,VI: EOMI w/o nystagmus, no ptosis   V: L face sensory deficit   VII: Eyelid closure was full.  L UMN facial droop   VIII: Hearing intact to voice   IX,X: Voice normal, palate elevates symmetrically    XI: SCM/trap 5/5 bilat   XII: Tongue protrudes midline, no atrophy or fasciculations   *Motor:   Normal bulk.  No tremor, rigidity or bradykinesia. No pronator drift.    Strength: Dlt Bic Tri WrE WrF FgS Gr HF KnF KnE PlF DoF    Left 5 5 5 5 5 5 5 5 5 5 5 5     Right 5 5 5 5 5 5 5 5 5 5 5 5     *Sensory: Intact to light touch, pinprick, temperature vibration throughout. Symmetric. Propioception intact bilat.  No double-simultaneous extinction.  *Coordination:   Finger-to-nose, heel-to-shin, rapid alternating motions were intact. *Reflexes:  2+ and symmetric throughout without clonus; toes down-going bilat *Gait: deferred  NIHSS = 2 for facial droop and sensory deficit  Premorbid mRS = 0   Labs   CBC:  Recent Labs  Lab 07/04/22 1214  WBC 6.8  NEUTROABS 3.8  HGB 14.4  HCT 42.2  MCV 91.5  PLT 99991111    Basic Metabolic Panel:  Lab Results  Component Value Date   NA 138 07/04/2022   K 3.6 07/04/2022   CO2 24 07/04/2022   GLUCOSE 141 (H) 07/04/2022   BUN 9 07/04/2022   CREATININE 0.52 07/04/2022   CALCIUM 9.4 07/04/2022   GFRNONAA >60 07/04/2022   GFRAA >60 08/26/2019   Lipid Panel:  Lab Results  Component Value Date   LDLCALC 83 08/26/2019   HgbA1c:  Lab Results  Component Value Date   HGBA1C 9.2 (H) 08/26/2019   Urine Drug Screen: No results found for: "LABOPIA", "COCAINSCRNUR", "LABBENZ", "AMPHETMU", "THCU", "LABBARB"  Alcohol Level     Component Value Date/Time   ETH <10 07/04/2022 1214     Impression   This is a 52 year old woman with past medical history significant for diabetes, hyperlipidemia, hypertension who presents with acute onset of left-sided facial numbness and left facial droop at 10:45 AM.  NIH stroke scale was 2 for sensory and facial droop.  CT head showed no acute intracranial process on personal review.  TNK was not administered due to symptoms being too mild to treat.  CTA was not performed due to exam not consistent with LVO.  She was observed in the ED until she was outside of the TNK window at 3:15 PM.  I suspect that her symptoms are secondary to complex migraine as she did develop a slight headache over the course of the ED stay although she does have a history of TIA and multiple cerebrovascular risk factors.  She has not had a full TIA workup since 2021 and I feel that it is worth repeating.  Given how stereotyped her symptoms are it would also be prudent to get an EEG as  well.   Recommendations   - Admit for stroke workup - Permissive HTN x48 hrs from sx onset or until stroke ruled out by MRI goal BP <220/110. PRN labetalol or hydralazine if BP above these parameters. Avoid oral antihypertensives. - MRI brain wo contrast - CTA or MRA H&N - TTE w/ bubble - Check A1c and LDL + add statin per guidelines - ASA 81mg  daily + plavix 75mg  daily x21 days f/b ASA 81mg  daily monotherapy after that - q4 hr neuro checks - STAT head CT for any change in neuro exam - Tele - PT/OT/SLP - Stroke education - rEEG - F/u with Dr. Billey Gosling at [facility] after discharge (already established)  ______________________________________________________________________   Thank you for the opportunity to take part in the care of this patient. If you have any further questions, please contact the neurology consultation attending.  Signed,  Su Monks, MD [facility] [phone]  If 7pm- 7am, please page neurology on call as listed in [place].  **Any copied and pasted documentation in this note was written by me in another application not billed for and pasted by me into this document.

## 2022-07-04 NOTE — ED Triage Notes (Signed)
Pt here with husband who states that pt was fine and suddenly at 1045 she began having difficulty word finding and some what slurred speech with numbness to left side of face. Pt denies any weakness.

## 2022-07-04 NOTE — ED Notes (Signed)
Pt advises that she is also now having tingling down her left arm and affecting the feeling in her 4th and 5th fingers.

## 2022-07-04 NOTE — ED Notes (Addendum)
Neuro Cart in room and all info provided at this time to RN Tracie.

## 2022-07-04 NOTE — H&P (Signed)
History and Physical    Patient: Jody Gonzalez KKX:[id number] DOB: 04-05-1971 DOA: 07/04/2022 DOS: the patient was seen and examined on 07/04/2022 PCP: Jene Every, MD  Patient coming from: Home  Chief Complaint:  Chief Complaint  Patient presents with   Code Stroke   HPI: Jody Gonzalez is a 52 y.o. female with medical history significant of DM2, HTN, dyslipidemia, migraine headaches who presented to hospital with slurred speech and left facial numbness. Patient previously had 3 similar episodes similar to todays. Extensive workup did not show any stroke.   Her symptoms started at around 1045 am today, she had slurred speech, left facial numbness. She also had numbness in her  left sided are and fingers. Then she started having a headache, right sided, throbbing, mild. She also had unsteady gait. Her symptoms have been better after a few hours.  She has been seen by neurology, CT head did not show stroke. Stroke workup and EEG ordered.  Review of Systems: As mentioned in the history of present illness. All other systems reviewed and are negative. Past Medical History:  Diagnosis Date   Arthritis    Diabetes mellitus without complication ([place])    Hyperlipidemia    Hypertension    Type 2 diabetes mellitus ([place])    No past surgical history on file. Social History:  reports that she has never smoked. She has never used smokeless tobacco. She reports current alcohol use of about 2.0 standard drinks of alcohol per week. She reports that she does not use drugs.  Allergies  Allergen Reactions   Azithromycin    Imitrex [Sumatriptan]    Lisinopril Cough    Family History  Problem Relation Age of Onset   Diabetes Mother    Heart attack Mother 17   Hyperlipidemia Father    Hypertension Father    Heart attack Sister 53    Prior to Admission medications   Medication Sig Start Date End Date Taking? Authorizing Provider  Atogepant (QULIPTA) 60 MG TABS Take 60 mg by mouth daily. 04/03/22    Genia Harold, MD  atorvastatin (LIPITOR) 10 MG tablet Take 10 mg by mouth daily. 02/03/22   [provider]  DULoxetine (CYMBALTA) 20 MG capsule Take 1 capsule (20 mg total) by mouth daily. 04/11/22   Genia Harold, MD  empagliflozin (JARDIANCE) 25 MG TABS tablet Take by mouth daily.    [provider]  insulin glargine (LANTUS) 100 UNIT/ML injection Inject 90 Units into the skin daily.    [provider]  metFORMIN (GLUCOPHAGE) 1000 MG tablet Take 1 tablet (1,000 mg total) by mouth 2 (two) times daily with a meal. 06/19/19 04/03/22  Earleen Newport, MD  metoprolol succinate (TOPROL-XL) 25 MG 24 hr tablet Take 25 mg by mouth daily.    [provider]  Multiple Vitamins-Minerals (HAIR SKIN AND NAILS FORMULA) TABS Take 1 tablet by mouth as directed.    [provider]  nitroGLYCERIN (NITROSTAT) 0.4 MG SL tablet Place 1 tablet (0.4 mg total) under the tongue every 5 (five) minutes as needed for chest pain. Maximum of 3 doses. 05/24/20   End, Harrell Gave, MD  telmisartan (MICARDIS) 40 MG tablet Take 0.5 tablets (20 mg total) by mouth daily. 08/26/19   Wieting, Richard, MD  Ubrogepant (UBRELVY) 100 MG TABS Take 100 mg by mouth as needed (for migraine). May repeat a dose in 2 hours if needed. Max dose 2 pills in 24 hours 04/03/22   Genia Harold, MD    Physical Exam:  Vitals:   07/04/22 1330 07/04/22 1400 07/04/22 1430 07/04/22 1500  BP: 131/88 126/86 121/82 128/87  Pulse: 63 72 73 66  Resp: 10 17 19 12   Temp:      TempSrc:      SpO2: 100% 99% 99% 100%  Weight:      Height:       Physical Exam Constitutional:      General: She is not in acute distress.    Appearance: Normal appearance. She is not ill-appearing or toxic-appearing.  HENT:     Head: Normocephalic and atraumatic.     Nose: Nose normal. No congestion or rhinorrhea.     Mouth/Throat:     Mouth: Mucous membranes are moist.     Pharynx: Oropharynx is clear. No oropharyngeal  exudate.  Cardiovascular:     Rate and Rhythm: Regular rhythm.     Heart sounds: No murmur heard.    No gallop.  Pulmonary:     Effort: Pulmonary effort is normal. No respiratory distress.     Breath sounds: Normal breath sounds. No wheezing.  Abdominal:     General: Abdomen is flat. Bowel sounds are normal. There is no distension.     Palpations: Abdomen is soft.     Tenderness: There is no abdominal tenderness.  Musculoskeletal:        General: No swelling. Normal range of motion.     Cervical back: Normal range of motion and neck supple. No rigidity or tenderness.  Lymphadenopathy:     Cervical: No cervical adenopathy.  Skin:    General: Skin is warm.     Coloration: Skin is not jaundiced.  Neurological:     General: No focal deficit present.     Mental Status: She is alert and oriented to person, place, and time.     Cranial Nerves: No cranial nerve deficit.     Motor: No weakness.  Psychiatric:        Mood and Affect: Mood normal.        Behavior: Behavior normal.     Data Reviewed:  Reviewed CT results, lab results.   Assessment and Plan: Left sided numbness Migraine headache. .  Patient has been seen by neurology, started stroke workup. But presentation is more consistent with complex migraine.  Will follow up with imaging studies. If negative, patient most likely will be dischanged to home tomorrow.  Type 2 diabetes. Patient was taking 90 units of lantus at home. Will continue lower dose, add novolog and sliding scale insulin. May adjust to home dose tomorrow if glucose runs high. Check A1C.  Essential Hypertension.  Resume home medicines.    Advance Care Planning:   Code Status: Full Code   Consults: Neurology  Family Communication: Husband at bedside  Severity of Illness: The appropriate patient status for this patient is OBSERVATION. Observation status is judged to be reasonable and necessary in order to provide the required intensity of service to  ensure the patient's safety. The patient's presenting symptoms, physical exam findings, and initial radiographic and laboratory data in the context of their medical condition is felt to place them at decreased risk for further clinical deterioration. Furthermore, it is anticipated that the patient will be medically stable for discharge from the hospital within 2 midnights of admission.   Author: Sharen Hones, MD 07/04/2022 3:40 PM  For on call review [email or web address].

## 2022-07-04 NOTE — Telephone Encounter (Signed)
Pt's husband called to cancel appt due to pt in ED with stroke like symptoms.

## 2022-07-04 NOTE — Progress Notes (Signed)
   07/04/22 1500  Clinical Encounter Type  Visited With Patient and family together  Visit Type Initial  Referral From Nurse  Consult/Referral To Chaplain   Chaplain responded to code stroke. Patient with care team and so Chaplain returned later. Chaplain provided a compassionate presence and reflective listening as patient and family member spoke about health challenges. Chaplain provided support as needed. Chaplain services are available for follow up as needed.

## 2022-07-04 NOTE — ED Notes (Signed)
Patient states all symptoms that she came into ed with are now resolved other than she still has a slight headache.

## 2022-07-04 NOTE — Progress Notes (Signed)
Eeg done 

## 2022-07-05 ENCOUNTER — Observation Stay
Admit: 2022-07-05 | Discharge: 2022-07-05 | Disposition: A | Discharge status: Home / Self Care | Attending: Neurology | Admitting: Neurology

## 2022-07-05 ENCOUNTER — Observation Stay (HOSPITAL_BASED_OUTPATIENT_CLINIC_OR_DEPARTMENT_OTHER)
Admit: 2022-07-05 | Discharge: 2022-07-05 | Disposition: A | Discharge status: Home / Self Care | Attending: Neurology | Admitting: Neurology

## 2022-07-05 ENCOUNTER — Encounter: Payer: Self-pay | Admitting: Internal Medicine

## 2022-07-05 DIAGNOSIS — R202 Paresthesia of skin: Secondary | ICD-10-CM | POA: Diagnosis not present

## 2022-07-05 DIAGNOSIS — G459 Transient cerebral ischemic attack, unspecified: Secondary | ICD-10-CM

## 2022-07-05 DIAGNOSIS — R4701 Aphasia: Secondary | ICD-10-CM

## 2022-07-05 DIAGNOSIS — R200 Anesthesia of skin: Secondary | ICD-10-CM | POA: Diagnosis not present

## 2022-07-05 LAB — ECHOCARDIOGRAM COMPLETE BUBBLE STUDY
AR max vel: 3.05 cm2
AV Area VTI: 3.96 cm2
AV Area mean vel: 2.89 cm2
AV Mean grad: 2 mmHg
AV Peak grad: 2.8 mmHg
Ao pk vel: 0.83 m/s
Area-P 1/2: 3.85 cm2
S' Lateral: 2.7 cm

## 2022-07-05 LAB — LIPID PANEL
Cholesterol: 190 mg/dL (ref 0–200)
HDL: 26 mg/dL — ABNORMAL LOW (ref >40)
LDL Cholesterol: UNDETERMINED mg/dL (ref 0–99)
Total CHOL/HDL Ratio: 7.3 RATIO
Triglycerides: 1079 mg/dL — ABNORMAL HIGH (ref <150)
VLDL: UNDETERMINED mg/dL (ref 0–40)

## 2022-07-05 LAB — VITAMIN B12: Vitamin B-12: 162 pg/mL — ABNORMAL LOW (ref 180–914)

## 2022-07-05 LAB — CBG MONITORING, ED: Glucose-Capillary: 144 mg/dL — ABNORMAL HIGH (ref 70–99)

## 2022-07-05 LAB — LDL CHOLESTEROL, DIRECT: Direct LDL: 68 mg/dL (ref 0–99)

## 2022-07-05 LAB — HIV ANTIBODY (ROUTINE TESTING W REFLEX): HIV Screen 4th Generation wRfx: NONREACTIVE

## 2022-07-05 LAB — GLUCOSE, CAPILLARY: Glucose-Capillary: 196 mg/dL — ABNORMAL HIGH (ref 70–99)

## 2022-07-05 MED ORDER — ATORVASTATIN CALCIUM 20 MG PO TABS: 40.0000 mg | ORAL_TABLET | Freq: Every day | ORAL | Status: DC

## 2022-07-05 MED ORDER — ATORVASTATIN CALCIUM 20 MG PO TABS: 20.0000 mg | ORAL_TABLET | Freq: Every day | ORAL | 2 refills | Status: AC

## 2022-07-05 MED ORDER — CYANOCOBALAMIN 1000 MCG/ML IJ SOLN
1000.0000 ug | Freq: Once | INTRAMUSCULAR | Status: AC
  Administered 2022-07-05: 1000 ug via INTRAMUSCULAR
  Filled 2022-07-05 (×2): qty 1

## 2022-07-05 MED ORDER — CLOPIDOGREL BISULFATE 75 MG PO TABS: 75.0000 mg | ORAL_TABLET | Freq: Every day | ORAL | 0 refills | Status: AC

## 2022-07-05 MED ORDER — VITAMIN D (ERGOCALCIFEROL) 1.25 MG (50000 UNIT) PO CAPS
50000.0000 [IU] | ORAL_CAPSULE | ORAL | Status: DC
  Administered 2022-07-05: 50000 [IU] via ORAL
  Filled 2022-07-05: qty 1

## 2022-07-05 MED ORDER — VITAMIN D (ERGOCALCIFEROL) 1.25 MG (50000 UNIT) PO CAPS: 50000.0000 [IU] | ORAL_CAPSULE | ORAL | 0 refills | Status: AC

## 2022-07-05 MED ORDER — ASPIRIN 81 MG PO TBEC: 81.0000 mg | DELAYED_RELEASE_TABLET | Freq: Every day | ORAL | 12 refills | Status: AC

## 2022-07-05 MED ORDER — VITAMIN B-12 1000 MCG PO TABS: 1000.0000 ug | ORAL_TABLET | Freq: Every day | ORAL | Status: DC

## 2022-07-05 MED ORDER — METOCLOPRAMIDE HCL 5 MG/ML IJ SOLN
10.0000 mg | Freq: Once | INTRAMUSCULAR | Status: AC
  Administered 2022-07-05: 10 mg via INTRAVENOUS
  Filled 2022-07-05: qty 2

## 2022-07-05 MED ORDER — FENOFIBRATE 54 MG PO TABS
54.0000 mg | ORAL_TABLET | Freq: Every day | ORAL | Status: DC
  Administered 2022-07-05: 54 mg via ORAL
  Filled 2022-07-05 (×2): qty 1

## 2022-07-05 MED ORDER — CYANOCOBALAMIN 1000 MCG PO TABS: 1000.0000 ug | ORAL_TABLET | Freq: Every day | ORAL | 0 refills | Status: AC

## 2022-07-05 MED ORDER — DIPHENHYDRAMINE HCL 50 MG/ML IJ SOLN
25.0000 mg | Freq: Once | INTRAMUSCULAR | Status: AC
  Administered 2022-07-05: 25 mg via INTRAVENOUS
  Filled 2022-07-05: qty 1

## 2022-07-05 MED ORDER — FENOFIBRATE 54 MG PO TABS: 54.0000 mg | ORAL_TABLET | Freq: Every day | ORAL | 2 refills | Status: AC

## 2022-07-05 NOTE — Progress Notes (Signed)
OT Cancellation Note  Patient Details Name: Jody Gonzalez MRN: [id number] DOB: [birth year]   Cancelled Treatment:    Reason Eval/Treat Not Completed: Other (comment);OT screened, no needs identified, will sign off Per chart, pt amb to bathroom without assistance, pt endorses she feels that she is close to her baseline ADL status, no concerns with vision, strength, balance in regards to ADLs. Pt provided role of OT, rehab, endorses she feels she does not need services at this time.  OT will screen at this time and sign off. Please reconsult if there is a change in functional status.   Shanon Payor, OTD OTR/L  07/05/22, 10:08 AM

## 2022-07-05 NOTE — Discharge Summary (Signed)
[facility] Discharge Summary   Patient: Jody Gonzalez [id number]  PCP: Jene Every, MD  Date of admission: 07/04/2022   Date of discharge:  07/05/2022     Discharge Diagnoses:   Principal Problem:   CVA (cerebral vascular accident) [facility]) ruled out Active Problems:   Essential hypertension   Hyperlipidemia LDL goal <70   DM type 2 (diabetes mellitus, type 2) ([place])   Left facial numbness   Admitted From: Home Disposition:  Home   Recommendations for Outpatient Follow-up:  PCP: in 1 wk Neurology in 1-2 wks Follow up LABS/TEST:     Diet recommendation: Cardiac diet  Activity: The patient is advised to gradually reintroduce usual activities, as tolerated  Discharge Condition: stable  Code Status: Full code   History of present illness: As per the H and P dictated on admission Hospital Course:  Jody Gonzalez is a 52 y.o. female with medical history significant of DM2, HTN, dyslipidemia, migraine headaches who presented to hospital with slurred speech and left facial numbness. Patient previously had 3 similar episodes similar to todays. Extensive workup did not show any stroke.  Her symptoms started at around 1045 am today, she had slurred speech, left facial numbness. She also had numbness in her  left sided are and fingers. Then she started having a headache, right sided, throbbing, mild. She also had unsteady gait. Her symptoms have been better after a few hours. She has been seen by neurology, CT head did not show stroke. Stroke workup and EEG ordered.   Assessment and Plan:  # Left sided numbness and Migraine headache, Resolved  Patient has been seen by neurology, started stroke workup negative. presentation is more consistent with complex migraine.  EEG negative.  MRI brain: No acute intracranial pathology.  CTA H/N: No intracranial large vessel occlusion or significant stenosis. No hemodynamically significant stenosis in the neck.  Patient was cleared by neurology  on aspirin and Plavix for 21 days followed by aspirin 81 mg p.o. daily.  Lipitor increased from 10 to 20 mg as patient has intolerance, develops myalgia.  Patient was advised to gradually increase Lipitor to 40 mg p.o. daily as per her tolerance.  # Hypertriglyceridemia, triglycerides 1079, direct LDL 68, increase Lipitor 20 mg regularly and started on fenofibrate.  Patient was advised to continue low-fat diet.  Follow with PCP to repeat lipid profile after 3 to 6 months.  # Vitamin B12 deficiency: B12 level 162, target >400.  S/p vitamin B12 1000 mcg IM injection x 1 given during hospital stay, followed by oral supplement.  Follow-up PCP to repeat vitamin B12 level after 3 to 6 months.   # Type 2 diabetes, HbA1c 7.8 well-controlled Resumed home regimen of insulin and patient was advised to continue diabetic diet and monitor FSBG at home. # Essential Hypertension, Resume home medicines.     Body mass index is 27.73 kg/m.  Nutrition Interventions:    Patient was ambulatory without any assistance. Patient was seen by physical therapy, who recommended no therapy needed on discharge On the day of the discharge the patient's vitals were stable, and no other acute medical condition were reported by patient. the patient was felt safe to be discharge at Home.  Consultants: Neuro Procedures: None  Discharge Exam: General: Appear in no distress, no Rash; Oral Mucosa Clear, moist. Cardiovascular: S1 and S2 Present, no Murmur, Respiratory: normal respiratory effort, Bilateral Air entry present and no Crackles, no wheezes Abdomen: Bowel Sound present, Soft and no tenderness, no hernia Extremities:  no Pedal edema, no calf tenderness Neurology: alert and oriented to time, place, and person affect appropriate.  Filed Weights   07/04/22 1206  Weight: 71 kg   Vitals:   07/05/22 0733 07/05/22 1106  BP:  112/79  Pulse: 65 79  Resp: 13 18  Temp:  98.5 F (36.9 C)  SpO2: 97% 97%    DISCHARGE  MEDICATION: Allergies as of 07/05/2022       Reactions   Azithromycin    Erythromycin Anaphylaxis, Hives   RED PATCHES, ITCHING, AND THRO   Imitrex [sumatriptan]    Lisinopril Cough        Medication List     STOP taking these medications    meloxicam 7.5 MG tablet Commonly known as: MOBIC   Qulipta 60 MG Tabs Generic drug: Atogepant   Ubrelvy 100 MG Tabs Generic drug: Ubrogepant       TAKE these medications    aspirin EC 81 MG tablet Take 1 tablet (81 mg total) by mouth daily. Swallow whole. Start taking on: July 06, 2022   atorvastatin 20 MG tablet Commonly known as: LIPITOR Take 1 tablet (20 mg total) by mouth daily. What changed:  medication strength how much to take   clopidogrel 75 MG tablet Commonly known as: PLAVIX Take 1 tablet (75 mg total) by mouth daily for 20 days. Start taking on: July 06, 2022   cyanocobalamin 1000 MCG tablet Take 1 tablet (1,000 mcg total) by mouth daily. Start taking on: July 06, 2022   DULoxetine 20 MG capsule Commonly known as: Cymbalta Take 1 capsule (20 mg total) by mouth daily.   fenofibrate 54 MG tablet Take 1 tablet (54 mg total) by mouth daily.   Hair Skin and Nails Formula Tabs Take 1 tablet by mouth as directed.   insulin glargine 100 UNIT/ML injection Commonly known as: LANTUS Inject 90 Units into the skin daily.   Jardiance 25 MG Tabs tablet Generic drug: empagliflozin Take 25 mg by mouth daily.   metFORMIN 1000 MG tablet Commonly known as: Glucophage Take 1 tablet (1,000 mg total) by mouth 2 (two) times daily with a meal.   metoprolol succinate 25 MG 24 hr tablet Commonly known as: TOPROL-XL Take 25 mg by mouth daily.   nitroGLYCERIN 0.4 MG SL tablet Commonly known as: NITROSTAT Place 1 tablet (0.4 mg total) under the tongue every 5 (five) minutes as needed for chest pain. Maximum of 3 doses.   NovoLOG FlexPen 100 UNIT/ML FlexPen Generic drug: insulin aspart Inject 2-10 Units into  the skin 3 (three) times daily with meals. Only as needed for Blood Sugar above 150 before meal   telmisartan 40 MG tablet Commonly known as: Micardis Take 0.5 tablets (20 mg total) by mouth daily. What changed: how much to take   Trulicity 4.5 0000000 Sopn Generic drug: Dulaglutide Inject 4.5 mg into the skin once a week. Tuesday   Vitamin D (Ergocalciferol) 1.25 MG (50000 UNIT) Caps capsule Commonly known as: DRISDOL Take 1 capsule (50,000 Units total) by mouth every 7 (seven) days.       Allergies  Allergen Reactions   Azithromycin    Erythromycin Anaphylaxis and Hives    RED PATCHES, ITCHING, AND THRO   Imitrex [Sumatriptan]    Lisinopril Cough   Discharge Instructions     Call MD for:   Complete by: As directed    Any weakness or numbness, blurry vision or difficulty with speech   Call MD for:  extreme fatigue  Complete by: As directed    Call MD for:  persistant dizziness or light-headedness   Complete by: As directed    Call MD for:  persistant nausea and vomiting   Complete by: As directed    Call MD for:  severe uncontrolled pain   Complete by: As directed    Diet - low sodium heart healthy   Complete by: As directed    Discharge instructions   Complete by: As directed    Follow with PCP in 1 week, repeat lipid profile after 3 months, repeat vitamin B12 and vitamin D level after 3 to 6 months. Follow-up with Neurology in 1--2 weeks   Increase activity slowly   Complete by: As directed        The results of significant diagnostics from this hospitalization (including imaging, microbiology, ancillary and laboratory) are listed below for reference.    Significant Diagnostic Studies: ECHOCARDIOGRAM COMPLETE BUBBLE STUDY  Result Date: 07/05/2022    ECHOCARDIOGRAM REPORT   Patient Name:   Jody Gonzalez Date of Exam: 07/05/2022 Medical Rec #:  [id number]    Height:       63.0 in Accession #:    [id number]   Weight:       156.5 lb Date of Birth:  15-May-1971      BSA:          1.742 m Patient Age:    57 years     BP:           102/75 mmHg Patient Gender: F            HR:           66 bpm. Exam Location:  [facility] Procedure: 2D Echo, Cardiac Doppler, Color Doppler and Saline Contrast Bubble            Study Indications:     TIA 435.9 / G45.9  History:         Patient has prior history of Echocardiogram examinations, most                  recent 08/26/2019. Risk Factors:Diabetes and Hypertension.  Sonographer:     Sherrie Sport Referring Phys:  [place] Diagnosing Phys: Ida Rogue MD  Sonographer Comments: Suboptimal apical window. IMPRESSIONS  1. Left ventricular ejection fraction, by estimation, is 55 to 60%. The left ventricle has normal function. The left ventricle has no regional wall motion abnormalities. Left ventricular diastolic parameters were normal.  2. Right ventricular systolic function is normal. The right ventricular size is normal. There is normal pulmonary artery systolic pressure. The estimated right ventricular systolic pressure is [id number] mmHg.  3. The mitral valve is normal in structure. Mild mitral valve regurgitation. No evidence of mitral stenosis.  4. The aortic valve has an indeterminant number of cusps. Aortic valve regurgitation is not visualized. No aortic stenosis is present.  5. The inferior vena cava is normal in size with greater than 50% respiratory variability, suggesting right atrial pressure of 3 mmHg.  6. Agitated saline contrast bubble study was negative, with no evidence of any interatrial shunt. FINDINGS  Left Ventricle: Left ventricular ejection fraction, by estimation, is 55 to 60%. The left ventricle has normal function. The left ventricle has no regional wall motion abnormalities. The left ventricular internal cavity size was normal in size. There is  no left ventricular hypertrophy. Left ventricular diastolic parameters were normal. Right Ventricle: The right ventricular size is normal. No increase in right ventricular  wall thickness. Right ventricular systolic function is normal. There is normal pulmonary artery systolic pressure. The tricuspid regurgitant velocity is 1.94 m/s, and  with an assumed right atrial pressure of 3 mmHg, the estimated right ventricular systolic pressure is 61.9 mmHg. Left Atrium: Left atrial size was normal in size. Right Atrium: Right atrial size was normal in size. Pericardium: There is no evidence of pericardial effusion. Mitral Valve: The mitral valve is normal in structure. Mild mitral valve regurgitation. No evidence of mitral valve stenosis. Tricuspid Valve: The tricuspid valve is normal in structure. Tricuspid valve regurgitation is not demonstrated. No evidence of tricuspid stenosis. Aortic Valve: The aortic valve has an indeterminant number of cusps. Aortic valve regurgitation is not visualized. No aortic stenosis is present. Aortic valve mean gradient measures 2.0 mmHg. Aortic valve peak gradient measures 2.8 mmHg. Aortic valve area, by VTI measures 3.96 cm. Pulmonic Valve: The pulmonic valve was normal in structure. Pulmonic valve regurgitation is trivial. No evidence of pulmonic stenosis. Aorta: The aortic root is normal in size and structure. Venous: The inferior vena cava is normal in size with greater than 50% respiratory variability, suggesting right atrial pressure of 3 mmHg. IAS/Shunts: No atrial level shunt detected by color flow Doppler. Agitated saline contrast was given intravenously to evaluate for intracardiac shunting. Agitated saline contrast bubble study was negative, with no evidence of any interatrial shunt. There  is no evidence of a patent foramen ovale. There is no evidence of an atrial septal defect.  LEFT VENTRICLE PLAX 2D LVIDd:         4.10 cm   Diastology LVIDs:         2.70 cm   LV e' medial:    7.72 cm/s LV PW:         0.80 cm   LV E/e' medial:  10.5 LV IVS:        1.00 cm   LV e' lateral:   9.79 cm/s LVOT diam:     2.00 cm   LV E/e' lateral: 8.2 LV SV:          59 LV SV Index:   34 LVOT Area:     3.14 cm  RIGHT VENTRICLE RV Basal diam:  2.20 cm RV Mid diam:    2.10 cm RV S prime:     12.00 cm/s TAPSE (M-mode): 2.0 cm LEFT ATRIUM             Index        RIGHT ATRIUM           Index LA diam:        2.30 cm 1.32 cm/m   RA Area:     12.50 cm LA Vol (A2C):   17.2 ml 9.87 ml/m   RA Volume:   28.80 ml  16.53 ml/m LA Vol (A4C):   18.0 ml 10.33 ml/m LA Biplane Vol: 17.9 ml 10.27 ml/m  AORTIC VALVE AV Area (Vmax):    3.05 cm AV Area (Vmean):   2.89 cm AV Area (VTI):     3.96 cm AV Vmax:           83.10 cm/s AV Vmean:          59.000 cm/s AV VTI:            0.150 m AV Peak Grad:      2.8 mmHg AV Mean Grad:      2.0 mmHg LVOT Vmax:         80.60 cm/s  LVOT Vmean:        54.300 cm/s LVOT VTI:          0.189 m LVOT/AV VTI ratio: 1.26  AORTA Ao Root diam: 2.90 cm MITRAL VALVE               TRICUSPID VALVE MV Area (PHT): 3.85 cm    TR Peak grad:   15.1 mmHg MV Decel Time: 197 msec    TR Vmax:        194.00 cm/s MV E velocity: 80.70 cm/s MV A velocity: 72.30 cm/s  SHUNTS MV E/A ratio:  1.12        Systemic VTI:  0.19 m                            Systemic Diam: 2.00 cm Ida Rogue MD Electronically signed by Ida Rogue MD Signature Date/Time: 07/05/2022/1:04:18 PM    Final    EEG adult  Result Date: 07/05/2022 Derek Jack, MD     07/05/2022 12:18 PM Routine EEG Report Hanna Feit is a 52 y.o. female with a history of aphasia who is undergoing an EEG to evaluate for seizures. Report: This EEG was acquired with electrodes placed according to the International 10-20 electrode system (including Fp1, Fp2, F3, F4, C3, C4, P3, P4, O1, O2, T3, T4, T5, T6, A1, A2, Fz, Cz, Pz). The following electrodes were missing or displaced: none. The occipital dominant rhythm was 9 Hz. This activity is reactive to stimulation. Drowsiness was manifested by background fragmentation; deeper stages of sleep were identified by K complexes and sleep spindles. There was no focal slowing. There  were no interictal epileptiform discharges. There were no electrographic seizures identified. There was no abnormal response to photic stimulation or hyperventilation. Impression: This EEG was obtained while awake and asleep and is normal.   Clinical Correlation: Normal EEGs, however, do not rule out epilepsy. Su Monks, MD [facility] [phone] If 7pm- 7am, please page neurology on call as listed in [place].   MR BRAIN WO CONTRAST  Result Date: 07/04/2022 CLINICAL DATA:  Left facial droop and numbness. EXAM: MRI HEAD WITHOUT CONTRAST TECHNIQUE: Multiplanar, multiecho pulse sequences of the brain and surrounding structures were obtained without intravenous contrast. COMPARISON:  Same-day CT/CTA head and neck, brain MRI 10/04/2021 FINDINGS: Brain: There is no acute intracranial hemorrhage, extra-axial fluid collection, or acute infarct Parenchymal volume is normal. The ventricles are normal in size. Gray-white differentiation is preserved. There are scattered small foci of FLAIR signal abnormality in the supratentorial white matter which are unchanged, nonspecific. The pituitary and suprasellar region are normal. There is no mass lesion. There is no mass effect or midline shift. Vascular: Normal flow voids. Skull and upper cervical spine: Normal marrow signal. Sinuses/Orbits: The paranasal sinuses are clear. The globes and orbits are unremarkable. Other: None. IMPRESSION: No acute intracranial pathology. Electronically Signed   By: Valetta Mole M.D.   On: 07/04/2022 17:24   CT ANGIO HEAD NECK W WO CM  Result Date: 07/04/2022 CLINICAL DATA:  Stroke suspected, left-sided facial droop and numbness EXAM: CT ANGIOGRAPHY HEAD AND NECK TECHNIQUE: Multidetector CT imaging of the head and neck was performed using the standard protocol during bolus administration of intravenous contrast. Multiplanar CT image reconstructions and MIPs were obtained to evaluate the vascular anatomy. Carotid stenosis  measurements (when applicable) are obtained utilizing NASCET criteria, using the distal internal carotid diameter as the denominator. RADIATION DOSE REDUCTION: This exam  was performed according to the departmental dose-optimization program which includes automated exposure control, adjustment of the mA and/or kV according to patient size and/or use of iterative reconstruction technique. CONTRAST:  40mL OMNIPAQUE IOHEXOL 350 MG/ML SOLN COMPARISON:  08/25/2019 CTA head and neck, correlation is also made with CT head 07/04/2022 FINDINGS: CT HEAD FINDINGS For noncontrast findings, please see same day CT head. CTA NECK FINDINGS Aortic arch: Standard branching. Imaged portion shows no evidence of aneurysm or dissection. No significant stenosis of the major arch vessel origins. Right carotid system: No evidence of stenosis, dissection, or occlusion. Left carotid system: No evidence of stenosis, dissection, or occlusion. Vertebral arteries: No evidence of stenosis, dissection, or occlusion. Skeleton: No acute osseous abnormality. Mild degenerative changes in the cervical spine. Other neck: Negative. Upper chest: Negative. Review of the MIP images confirms the above findings CTA HEAD FINDINGS Anterior circulation: Both internal carotid arteries are patent to the termini, without significant stenosis. A1 segments patent. Normal anterior communicating artery. Anterior cerebral arteries are patent to their distal aspects. No M1 stenosis or occlusion. MCA branches perfused and symmetric. Posterior circulation: Vertebral arteries patent to the vertebrobasilar junction without stenosis. Posterior inferior cerebellar arteries patent proximally. Basilar patent to its distal aspect. Superior cerebellar arteries patent proximally. Patent P1 segments. PCAs perfused to their distal aspects without stenosis. The bilateral posterior communicating arteries are not visualized. Venous sinuses: As permitted by contrast timing, patent.  Anatomic variants: None significant. Review of the MIP images confirms the above findings IMPRESSION: 1. No intracranial large vessel occlusion or significant stenosis. 2. No hemodynamically significant stenosis in the neck. Imaging results were communicated on 07/04/2022 at 4:48 pm to provider [facility] via secure text paging. Electronically Signed   By: Merilyn Baba M.D.   On: 07/04/2022 16:49   CT HEAD CODE STROKE WO CONTRAST  Result Date: 07/04/2022 CLINICAL DATA:  Code stroke. Left facial numbness, slurred speech, difficulty getting words out EXAM: CT HEAD WITHOUT CONTRAST TECHNIQUE: Contiguous axial images were obtained from the base of the skull through the vertex without intravenous contrast. RADIATION DOSE REDUCTION: This exam was performed according to the departmental dose-optimization program which includes automated exposure control, adjustment of the mA and/or kV according to patient size and/or use of iterative reconstruction technique. COMPARISON:  10/04/2021 FINDINGS: Brain: No evidence of acute infarction, hemorrhage, cerebral edema, mass, mass effect, or midline shift. No hydrocephalus or extra-axial collection. Vascular: No hyperdense vessel. Skull: Negative for fracture or focal lesion. Sinuses/Orbits: No acute finding. Other: The mastoid air cells are well aerated. ASPECTS [facility] Stroke Program Early CT Score) - Ganglionic level infarction (caudate, lentiform nuclei, internal capsule, insula, M1-M3 cortex): 7 - Supraganglionic infarction (M4-M6 cortex): 3 Total score (0-10 with 10 being normal): 10 IMPRESSION: No evidence of acute intracranial process. ASPECTS is 10. Code stroke imaging results were communicated on 07/04/2022 at 12:12 pm to provider [facility] via secure text paging. Electronically Signed   By: Merilyn Baba M.D.   On: 07/04/2022 12:13    Microbiology: No results found for this or any previous visit (from the past 240 hour(s)).   Labs: CBC: Recent Labs  Lab 07/04/22 1214  07/04/22 1627  WBC 6.8 6.7  NEUTROABS 3.8  --   HGB 14.4 13.5  HCT 42.2 38.9  MCV 91.5 91.7  PLT 212 [id number]   Basic Metabolic Panel: Recent Labs  Lab 07/04/22 1214 07/04/22 1627  NA 138  --   K 3.6  --   CL 105  --  CO2 24  --   GLUCOSE 141*  --   BUN 9  --   CREATININE 0.52 0.54  CALCIUM 9.4  --    Liver Function Tests: Recent Labs  Lab 07/04/22 1214  AST 24  ALT 34  ALKPHOS 104  BILITOT 0.9  PROT 7.4  ALBUMIN 4.8   No results for input(s): "LIPASE", "AMYLASE" in the last 168 hours. No results for input(s): "AMMONIA" in the last 168 hours. Cardiac Enzymes: No results for input(s): "CKTOTAL", "CKMB", "CKMBINDEX", "TROPONINI" in the last 168 hours. BNP (last 3 results) No results for input(s): "BNP" in the last 8760 hours. CBG: Recent Labs  Lab 07/04/22 1155 07/04/22 1624 07/04/22 2117 07/05/22 0837 07/05/22 1201  GLUCAP 142* 136* 163* 144* 196*    Time spent: 35 minutes  Signed:  Val Riles  [facility]  07/05/2022 2:28 PM

## 2022-07-05 NOTE — ED Notes (Signed)
Patient is resting comfortably. States her pain is relieved and is now 0/10

## 2022-07-05 NOTE — ED Notes (Signed)
pt complaining of a 6-7/10 headache behind eyes/base of back of head. Prn tylenol given earlier and she said it had minimal effectiveness. she says she has a hx of migraines and was supposed to get rx something for breakthrough pain control when one occurs but insurance wouldn't cover it. she is asking for something to help with the pain. Jody Fuller, NP made aware

## 2022-07-05 NOTE — Progress Notes (Signed)
       CROSS COVER NOTE  NAME: Jody Gonzalez MRN: [id number] DOB : July 13, 1970 ATTENDING PHYSICIAN: Sharen Hones, MD    Date of Service   07/05/2022   HPI/Events of Note   Medication request received for 6/10 headache not relieved by tylenol.   Interventions   Assessment/Plan: Benadryl Reglan       To reach the provider On-Call:   7AM- 7PM see care teams to locate the attending and reach out to them via [email or web address]. 7PM-7AM contact night-coverage If you still have difficulty reaching the appropriate provider, please page the [facility] (Director on Call) for [facility] on amion for assistance  This document was prepared using Set designer software and may include unintentional dictation errors.  Neomia Glass DNP, MBA, FNP-BC Nurse Practitioner [facility] Pager [phone]

## 2022-07-05 NOTE — Progress Notes (Signed)
PT Cancellation Note  Patient Details Name: Keyandra Swenson MRN: [id number] DOB: September 24, 1970   Cancelled Treatment:    Reason Eval/Treat Not Completed: PT screened, no needs identified, will sign off. Per OT assessment, patient has no mobility needs at this time. Will sign off.    Jonathin Heinicke 07/05/2022, 10:58 AM

## 2022-07-05 NOTE — Progress Notes (Signed)
*  PRELIMINARY RESULTS* Echocardiogram 2D Echocardiogram has been performed.  Jody Gonzalez 07/05/2022, 10:55 AM

## 2022-07-05 NOTE — Procedures (Signed)
Routine EEG Report  Jody Gonzalez is a 52 y.o. female with a history of aphasia who is undergoing an EEG to evaluate for seizures.  Report: This EEG was acquired with electrodes placed according to the International 10-20 electrode system (including Fp1, Fp2, F3, F4, C3, C4, P3, P4, O1, O2, T3, T4, T5, T6, A1, A2, Fz, Cz, Pz). The following electrodes were missing or displaced: none.  The occipital dominant rhythm was 9 Hz. This activity is reactive to stimulation. Drowsiness was manifested by background fragmentation; deeper stages of sleep were identified by K complexes and sleep spindles. There was no focal slowing. There were no interictal epileptiform discharges. There were no electrographic seizures identified. There was no abnormal response to photic stimulation or hyperventilation.   Impression: This EEG was obtained while awake and asleep and is normal.    Clinical Correlation: Normal EEGs, however, do not rule out epilepsy.  Su Monks, MD [facility] [phone]  If 7pm- 7am, please page neurology on call as listed in [place].

## 2022-07-05 NOTE — Progress Notes (Addendum)
SLP Cancellation Note  Patient Details Name: Jody Gonzalez MRN: [id number] DOB: 04-21-1971   Cancelled treatment:       Reason Eval/Treat Not Completed: SLP screened, no needs identified, will sign off (chart reviewed; consulted NSG then met w/ pt in room) Pt denied any difficulty swallowing and is currently on a regular diet; tolerates swallowing pills w/ water per NSG. Pt conversed in conversation w/out overt expressive/receptive deficits noted; pt denied any speech-language deficits. Speech intelligible. No further skilled ST services indicated as pt appears at her baseline. Encouraged pt to f/u w/ her PCP for skilled ST services if any changes from her baseline are noted upon returning home to her routine. Pt agreed. NSG to reconsult if any change in status while admitted.     Orinda Kenner, MS, CCC-SLP Speech Language Pathologist Rehab Services; [place] [phone] (ascom) Jody Gonzalez 07/05/2022, 9:23 AM

## 2022-07-05 NOTE — ED Notes (Signed)
Patient ambulated to bathroom with no assistance needed.

## 2022-07-06 LAB — HEMOGLOBIN A1C
Hgb A1c MFr Bld: 7.8 % — ABNORMAL HIGH (ref 4.8–5.6)
Mean Plasma Glucose: 177 mg/dL

## 2022-08-21 ENCOUNTER — Other Ambulatory Visit: Payer: Self-pay | Admitting: Neurology

## 2022-08-21 MED ORDER — DULOXETINE HCL 20 MG PO CPEP: 20.0000 mg | ORAL_CAPSULE | Freq: Every day | ORAL | 0 refills | Status: AC

## 2023-06-21 ENCOUNTER — Ambulatory Visit: Admission: EM | Admit: 2023-06-21 | Discharge: 2023-06-21 | Disposition: A | Discharge status: Home / Self Care

## 2023-06-21 ENCOUNTER — Ambulatory Visit (INDEPENDENT_AMBULATORY_CARE_PROVIDER_SITE_OTHER)

## 2023-06-21 DIAGNOSIS — M542 Cervicalgia: Secondary | ICD-10-CM | POA: Diagnosis not present

## 2023-06-21 DIAGNOSIS — M5412 Radiculopathy, cervical region: Secondary | ICD-10-CM

## 2023-06-21 MED ORDER — IBUPROFEN 600 MG PO TABS: 600.0000 mg | ORAL_TABLET | Freq: Four times a day (QID) | ORAL | 0 refills | Status: DC | PRN

## 2023-06-21 MED ORDER — BACLOFEN 10 MG PO TABS: 10.0000 mg | ORAL_TABLET | Freq: Three times a day (TID) | ORAL | 0 refills | Status: AC

## 2023-06-21 NOTE — ED Provider Notes (Addendum)
[facility]    CSN: [id number] Arrival date & time: 06/21/23  0940      History   Chief Complaint Chief Complaint  Patient presents with   Torticollis    HPI Jody Gonzalez is a 52 y.o. female.   HPI  52 year old female with a past medical history significant for type 2 diabetes, hypertension, hyperlipidemia, and arthritis presents for evaluation of right-sided neck stiffness and numbness to the back of her right upper arm that started yesterday.  The numbness comes and goes but it seems to be brought on by raising her arms over her head.  She states that she has been experiencing trouble with her back for approximately a month but she was moving things yesterday and she thinks she twisted wrong.  The pain increases with range of motion of her head and neck.  Past Medical History:  Diagnosis Date   Arthritis    Diabetes mellitus without complication (HCC)    Hyperlipidemia    Hypertension    Type 2 diabetes mellitus (HCC)     Patient Active Problem List   Diagnosis Date Noted   CVA (cerebral vascular accident) (HCC) 07/04/2022   Left facial numbness 07/04/2022   Complaint of headache    Word finding difficulty    DM type 2 (diabetes mellitus, type 2) (HCC) 08/25/2019   Migraine 08/25/2019   TIA (transient ischemic attack) 08/25/2019   Chest pain, moderate coronary artery risk 07/14/2019   Essential hypertension 07/14/2019   Hyperlipidemia LDL goal <70 07/14/2019    History reviewed. No pertinent surgical history.  OB History   No obstetric history on file.      Home Medications    Prior to Admission medications   Medication Sig Start Date End Date Taking? Authorizing Provider  atorvastatin (LIPITOR) 20 MG tablet Take 1 tablet (20 mg total) by mouth daily. 07/05/22 06/21/23 Yes Gillis Santa, MD  baclofen (LIORESAL) 10 MG tablet Take 1 tablet (10 mg total) by mouth 3 (three) times daily. 06/21/23  Yes Becky Augusta, NP  Continuous Glucose Sensor  (FREESTYLE LIBRE 3 SENSOR) MISC  06/11/23  Yes [provider]  empagliflozin (JARDIANCE) 25 MG TABS tablet Take 25 mg by mouth daily.   Yes [provider]  ibuprofen (ADVIL) 600 MG tablet Take 1 tablet (600 mg total) by mouth every 6 (six) hours as needed. 06/21/23  Yes Becky Augusta, NP  insulin glargine (LANTUS) 100 UNIT/ML injection Inject 90 Units into the skin daily.   Yes [provider]  ketoconazole (NIZORAL) 2 % shampoo SMARTSIG:Topical 3 Times a Week PRN 02/14/23  Yes [provider]  metFORMIN (GLUCOPHAGE) 1000 MG tablet Take 1 tablet (1,000 mg total) by mouth 2 (two) times daily with a meal. 06/19/19 06/21/23 Yes Emily Filbert, MD  OZEMPIC, 0.25 OR 0.5 MG/DOSE, 2 MG/3ML SOPN Inject into the skin. 03/13/23  Yes [provider]  pimecrolimus (ELIDEL) 1 % cream Apply topically.   Yes [provider]  telmisartan (MICARDIS) 40 MG tablet Take 0.5 tablets (20 mg total) by mouth daily. Patient taking differently: Take 40 mg by mouth daily. 08/26/19  Yes Wieting, Richard, MD  aspirin EC 81 MG tablet Take 1 tablet (81 mg total) by mouth daily. Swallow whole. 07/06/22   Gillis Santa, MD  Dulaglutide (TRULICITY) 4.5 MG/0.5ML SOPN Inject 4.5 mg into the skin once a week. Tuesday    [provider]  DULoxetine (CYMBALTA) 20 MG capsule Take 1 capsule (20 mg total) by  mouth daily. 08/21/22   Ocie Doyne, MD  fenofibrate 54 MG tablet Take 1 tablet (54 mg total) by mouth daily. 07/05/22 10/03/22  Gillis Santa, MD  metoprolol succinate (TOPROL-XL) 25 MG 24 hr tablet Take 25 mg by mouth daily.    [provider]  Multiple Vitamins-Minerals (HAIR SKIN AND NAILS FORMULA) TABS Take 1 tablet by mouth as directed. Patient not taking: Reported on 07/04/2022    [provider]  nitroGLYCERIN (NITROSTAT) 0.4 MG SL tablet Place 1 tablet (0.4 mg total) under the tongue every 5 (five) minutes as needed for chest pain. Maximum of 3  doses. 05/24/20   End, Cristal Deer, MD  NOVOLOG FLEXPEN 100 UNIT/ML FlexPen Inject 2-10 Units into the skin 3 (three) times daily with meals. Only as needed for Blood Sugar above 150 before meal    [provider]    Family History Family History  Problem Relation Age of Onset   Diabetes Mother    Heart attack Mother 61   Hyperlipidemia Father    Hypertension Father    Heart attack Sister 61    Social History Social History   Tobacco Use   Smoking status: Never    Passive exposure: Never   Smokeless tobacco: Never  Substance Use Topics   Alcohol use: Yes    Alcohol/week: 2.0 standard drinks of alcohol    Types: 2 Cans of beer per week   Drug use: Never     Allergies   Azithromycin, Erythromycin, Imitrex [sumatriptan], and Lisinopril   Review of Systems Review of Systems  Musculoskeletal:  Positive for neck pain and neck stiffness.  Neurological:  Positive for numbness. Negative for weakness.     Physical Exam Triage Vital Signs ED Triage Vitals [06/21/23 1058]  Encounter Vitals Group     BP      Systolic BP Percentile      Diastolic BP Percentile      Pulse      Resp      Temp      Temp src      SpO2      Weight      Height      Head Circumference      Peak Flow      Pain Score 9     Pain Loc      Pain Education      Exclude from Growth Chart    No data found.  Updated Vital Signs BP 127/87 (BP Location: Right Arm)   Pulse 69   Temp 98.5 F (36.9 C) (Oral)   Resp 19   SpO2 95%   Visual Acuity Right Eye Distance:   Left Eye Distance:   Bilateral Distance:    Right Eye Near:   Left Eye Near:    Bilateral Near:     Physical Exam Vitals and nursing note reviewed.  Constitutional:      Appearance: Normal appearance. She is not ill-appearing.  HENT:     Head: Normocephalic and atraumatic.  Musculoskeletal:        General: Tenderness present. No signs of injury.  Skin:    General: Skin is warm and dry.     Capillary Refill:  Capillary refill takes less than 2 seconds.     Findings: No bruising or erythema.  Neurological:     General: No focal deficit present.     Mental Status: She is alert and oriented to person, place, and time.      UC Treatments /  Results  Labs (all labs ordered are listed, but only abnormal results are displayed) Labs Reviewed - No data to display  EKG   Radiology No results found.  Procedures Procedures (including critical care time)  Medications Ordered in UC Medications - No data to display  Initial Impression / Assessment and Plan / UC Course  I have reviewed the triage vital signs and the nursing notes.  Pertinent labs & imaging results that were available during my care of the patient were reviewed by me and considered in my medical decision making (see chart for details).   Patient is a pleasant, nontoxic-appearing 52 year old female presenting for evaluation of right-sided neck stiffness as outlined HPI above.  On exam patient's head and neck are in normal axial alignment.  She has no midline spinous process tenderness or step-off in her cervical or thoracic vertebrae.  He does have of muscle tension and tenderness in the upper trapezius muscles bilaterally and bilateral cervical paraspinous region.  Patient's flexion, extension, and lateral rotation are limited secondary to muscle tension and pain.  Bilateral grips and upper extremity strength are 5/5.  I suspect this is most likely a cervical strain, however given the numbness in the patient's right upper arm I will obtain radiographs of the cervical spine to evaluate for any cervical radiculopathy.  Cervical spine x-rays independently reviewed and evaluated by me.  Impression: Patient has a loss of cervical lordosis.  There are degenerative changes at the distal endplate of the 4, both endplates of C5, and both endplates of C6.  There is radiolucent foreign body anterior to the distal endplate of see 5 that appears to be  free-floating.  Radiology overread is pending. Radiology impression states slight reversal of normal cervical lordosis in the upper cervical spine without significant listhesis.  Multilevel anterior endplate spurring mild disc height loss at C4-5 and C5-6.  Scattered mild facet arthropathy.  No significant bony neuroforaminal stenosis.  Mild cervical spondylosis.  I will discharge patient on the diagnosis of cervical radiculopathy with a prescription for ibuprofen and baclofen.  Also home physical therapy exercises.  If her symptoms do not improve I will have her follow-up with orthopedics.   Final Clinical Impressions(s) / UC Diagnoses   Final diagnoses:  Neck pain  Cervical radiculopathy     Discharge Instructions      Your neck x-rays show that you have some degenerative changes as well as a straightening of your cervical spine, most likely secondary to the spasm you are experiencing in your neck.  Take the ibuprofen 600 mg with food every 6 hours to help with pain and inflammation.  I would take this on a schedule for the next 48 hours and then use it on an as-needed basis.  Take the baclofen 10 mg every 8 hours to help with muscle spasm and tension.  I would also take this medication on a schedule for the next 48 hours and then use it on an as-needed basis.  You may apply moist heat to your neck for 20 minutes at a time, 2-3 times a day to help with improving blood flow to the muscle group and aid in relaxation of the muscles.  Follow the home physical therapy exercises given in your discharge instructions to help loosen the muscles in your neck.  If you do not have any improvement of your symptoms, or your symptoms worsen, I recommend that you follow-up with orthopedics as you may need dedicated physical therapy or advanced imaging.  ED Prescriptions     Medication Sig Dispense Auth. Provider   ibuprofen (ADVIL) 600 MG tablet Take 1 tablet (600 mg total) by mouth every 6  (six) hours as needed. 30 tablet Becky Augusta, NP   baclofen (LIORESAL) 10 MG tablet Take 1 tablet (10 mg total) by mouth 3 (three) times daily. 30 each Becky Augusta, NP      PDMP not reviewed this encounter.   Becky Augusta, NP 06/21/23 1208    Becky Augusta, NP 06/21/23 [phone]

## 2023-06-21 NOTE — Discharge Instructions (Addendum)
Your neck x-rays show that you have some degenerative changes as well as a straightening of your cervical spine, most likely secondary to the spasm you are experiencing in your neck.  Take the ibuprofen 600 mg with food every 6 hours to help with pain and inflammation.  I would take this on a schedule for the next 48 hours and then use it on an as-needed basis.  Take the baclofen 10 mg every 8 hours to help with muscle spasm and tension.  I would also take this medication on a schedule for the next 48 hours and then use it on an as-needed basis.  You may apply moist heat to your neck for 20 minutes at a time, 2-3 times a day to help with improving blood flow to the muscle group and aid in relaxation of the muscles.  Follow the home physical therapy exercises given in your discharge instructions to help loosen the muscles in your neck.  If you do not have any improvement of your symptoms, or your symptoms worsen, I recommend that you follow-up with orthopedics as you may need dedicated physical therapy or advanced imaging.

## 2023-06-21 NOTE — ED Triage Notes (Signed)
Patient states that she's been having back problems for about a month. Ice and heat that helped. She states that she was moving yesterday and thinks she may have twisted her shoulders and neck.

## 2023-10-09 ENCOUNTER — Encounter: Payer: Self-pay | Admitting: Emergency Medicine

## 2023-10-09 ENCOUNTER — Ambulatory Visit
Admission: EM | Admit: 2023-10-09 | Discharge: 2023-10-09 | Disposition: A | Discharge status: Home / Self Care | Attending: Emergency Medicine | Admitting: Emergency Medicine

## 2023-10-09 DIAGNOSIS — N644 Mastodynia: Secondary | ICD-10-CM | POA: Diagnosis not present

## 2023-10-09 DIAGNOSIS — R590 Localized enlarged lymph nodes: Secondary | ICD-10-CM | POA: Diagnosis not present

## 2023-10-09 MED ORDER — IBUPROFEN 600 MG PO TABS: 600.0000 mg | ORAL_TABLET | Freq: Three times a day (TID) | ORAL | 0 refills | Status: AC | PRN

## 2023-10-09 MED ORDER — CEPHALEXIN 500 MG PO CAPS: 1000.0000 mg | ORAL_CAPSULE | Freq: Two times a day (BID) | ORAL | 0 refills | Status: AC

## 2023-10-09 MED ORDER — DOXYCYCLINE HYCLATE 100 MG PO CAPS: 100.0000 mg | ORAL_CAPSULE | Freq: Two times a day (BID) | ORAL | 0 refills | Status: AC

## 2023-10-09 NOTE — ED Triage Notes (Signed)
 Pt presents with right breast pain and discharge coming from her breast with an odor since yesterday.

## 2023-10-09 NOTE — ED Provider Notes (Signed)
 HPI  SUBJECTIVE:  Jody Gonzalez is a 53 y.o. female who presents with right-sided breast pain starting yesterday with expressible, thick odorous white discharge from her nipple.  States that her nipple swollen.  No fevers, body aches.  No breast redness, swelling.  No trauma to the area.  No antipyretic in the past 6 hours.  She is not breast-feeding.  She has tried hot compresses and massage without improvement in her symptoms.  Symptoms are worse with wearing a bra.  She has never had symptoms like this before.  She does a breast self-exam regularly and states that there have been no recent changes.  Last mammogram 2023.  Past medical history negative for MRSA. She has a past medical history of hyperlipidemia, hypertension, diabetes, migraine, TIA/CVA. Family history significant for mother with breast cancer.  PCP: [facility].   Past Medical History:  Diagnosis Date   Arthritis    Diabetes mellitus without complication (HCC)    Hyperlipidemia    Hypertension    Type 2 diabetes mellitus (HCC)     History reviewed. No pertinent surgical history.  Family History  Problem Relation Age of Onset   Diabetes Mother    Heart attack Mother 71   Hyperlipidemia Father    Hypertension Father    Heart attack Sister 31    Social History   Tobacco Use   Smoking status: Never    Passive exposure: Never   Smokeless tobacco: Never  Vaping Use   Vaping status: Never Used  Substance Use Topics   Alcohol use: Yes    Alcohol/week: 2.0 standard drinks of alcohol    Types: 2 Cans of beer per week   Drug use: Never    No current facility-administered medications for this encounter.  Current Outpatient Medications:    cephALEXin (KEFLEX) 500 MG capsule, Take 2 capsules (1,000 mg total) by mouth 2 (two) times daily for 7 days., Disp: 28 capsule, Rfl: 0   doxycycline (VIBRAMYCIN) 100 MG capsule, Take 1 capsule (100 mg total) by mouth 2 (two) times daily for 7 days., Disp: 14 capsule, Rfl: 0    ibuprofen (ADVIL) 600 MG tablet, Take 1 tablet (600 mg total) by mouth every 8 (eight) hours as needed., Disp: 30 tablet, Rfl: 0   aspirin EC 81 MG tablet, Take 1 tablet (81 mg total) by mouth daily. Swallow whole., Disp: 30 tablet, Rfl: 12   atorvastatin (LIPITOR) 20 MG tablet, Take 1 tablet (20 mg total) by mouth daily., Disp: 30 tablet, Rfl: 2   baclofen (LIORESAL) 10 MG tablet, Take 1 tablet (10 mg total) by mouth 3 (three) times daily., Disp: 30 each, Rfl: 0   Continuous Glucose Sensor (FREESTYLE LIBRE 3 SENSOR) MISC, , Disp: , Rfl:    DULoxetine (CYMBALTA) 20 MG capsule, Take 1 capsule (20 mg total) by mouth daily., Disp: 90 capsule, Rfl: 0   empagliflozin (JARDIANCE) 25 MG TABS tablet, Take 25 mg by mouth daily., Disp: , Rfl:    fenofibrate 54 MG tablet, Take 1 tablet (54 mg total) by mouth daily., Disp: 30 tablet, Rfl: 2   insulin glargine (LANTUS) 100 UNIT/ML injection, Inject 90 Units into the skin daily., Disp: , Rfl:    ketoconazole (NIZORAL) 2 % shampoo, SMARTSIG:Topical 3 Times a Week PRN, Disp: , Rfl:    metFORMIN (GLUCOPHAGE) 1000 MG tablet, Take 1 tablet (1,000 mg total) by mouth 2 (two) times daily with a meal., Disp: 60 tablet, Rfl: 11   metoprolol succinate (TOPROL-XL) 25  MG 24 hr tablet, Take 25 mg by mouth daily., Disp: , Rfl:    Multiple Vitamins-Minerals (HAIR SKIN AND NAILS FORMULA) TABS, Take 1 tablet by mouth as directed. (Patient not taking: Reported on 07/04/2022), Disp: , Rfl:    nitroGLYCERIN (NITROSTAT) 0.4 MG SL tablet, Place 1 tablet (0.4 mg total) under the tongue every 5 (five) minutes as needed for chest pain. Maximum of 3 doses., Disp: 90 tablet, Rfl: 0   NOVOLOG FLEXPEN 100 UNIT/ML FlexPen, Inject 2-10 Units into the skin 3 (three) times daily with meals. Only as needed for Blood Sugar above 150 before meal, Disp: , Rfl:    OZEMPIC, 0.25 OR 0.5 MG/DOSE, 2 MG/3ML SOPN, Inject into the skin., Disp: , Rfl:    pimecrolimus (ELIDEL) 1 % cream, Apply topically., Disp: ,  Rfl:    telmisartan (MICARDIS) 40 MG tablet, Take 0.5 tablets (20 mg total) by mouth daily. (Patient taking differently: Take 40 mg by mouth daily.), Disp: 30 tablet, Rfl: 2  Allergies  Allergen Reactions   Azithromycin    Erythromycin Anaphylaxis and Hives    RED PATCHES, ITCHING, AND THRO   Imitrex [Sumatriptan]    Other Other (See Comments)    DRUG ALLERGIES VERIFIED PER L\T\D ORDER 14OCT08 GAG<br/>Reaction(s): Unknown; Note: DRUG ALLERGIES VERIFIED PER L\T\D ORDER 14OCT08 GAG   Lisinopril Cough     ROS  As noted in HPI.   Physical Exam  BP 111/78 (BP Location: Left Arm)   Pulse 70   Temp 98.5 F (36.9 C) (Oral)   Resp 16   SpO2 99%   Constitutional: Well developed, well nourished, no acute distress Eyes:  EOMI, conjunctiva normal bilaterally HENT: Normocephalic, atraumatic,mucus membranes moist Respiratory: Normal inspiratory effort Breasts: Breasts symmetric.  Right breast: Positive tenderness at in the right upper quadrant in the 10/11 o'clock position.  No erythema, induration, overlying skin changes.  Approximately 1 cm nontender mobile breast mass in the 3 o'clock position, patient states that this is not new.  Scant amount of thick, white, sebaceous material from nipple.  Nipple normal appearance.  Chaperone present during exam Lymph: Positive right axillary lymphadenopathy. Cardiovascular: Normal rate GI: nondistended skin: No rash, skin intact Musculoskeletal: no deformities Neurologic: Alert & oriented x 3, no focal neuro deficits Psychiatric: Speech and behavior appropriate   ED Course   Medications - No data to display  No orders of the defined types were placed in this encounter.   No results found for this or any previous visit (from the past 24 hours). No results found.  ED Clinical Impression  1. Breast pain, right   2. Axillary lymphadenopathy      ED Assessment/Plan     Concern for an early mastitis versus malignancy.  Does not  appear to be any abscess.  Will send home with Keflex and doxycycline for a week.  Ibuprofen/Tylenol, continue warm compresses.  Call PCP for mammogram tomorrow.  Her husband is an ED nurse.  Discussed signs and symptoms that should prompt return to the emergency department.  She agrees with plan.    Meds ordered this encounter  Medications   doxycycline (VIBRAMYCIN) 100 MG capsule    Sig: Take 1 capsule (100 mg total) by mouth 2 (two) times daily for 7 days.    Dispense:  14 capsule    Refill:  0   cephALEXin (KEFLEX) 500 MG capsule    Sig: Take 2 capsules (1,000 mg total) by mouth 2 (two) times daily for 7 days.  Dispense:  28 capsule    Refill:  0   ibuprofen (ADVIL) 600 MG tablet    Sig: Take 1 tablet (600 mg total) by mouth every 8 (eight) hours as needed.    Dispense:  30 tablet    Refill:  0      *This clinic note was created using Scientist, clinical (histocompatibility and immunogenetics). Therefore, there may be occasional mistakes despite careful proofreading.  ?    Domenick Gong, MD 10/11/23 [phone]

## 2023-10-09 NOTE — Discharge Instructions (Signed)
 Keflex and doxycycline for a week.  600 mg of ibuprofen combined with 1000 mg of Tylenol 3-4 times a day, continue warm compresses.  Call PCP for mammogram tomorrow.  Go to the ER for fevers, body aches, any signs of an abscess.
